# Patient Record
Sex: Female | Born: 1985 | Race: Asian | Hispanic: No | Marital: Married | Smoking: Never smoker
Health system: Southern US, Community
[De-identification: ages and names within clinical notes are randomized; demographics above are authoritative.]

## PROBLEM LIST (undated history)

## (undated) DIAGNOSIS — N83209 Unspecified ovarian cyst, unspecified side: Secondary | ICD-10-CM

## (undated) DIAGNOSIS — E7800 Pure hypercholesterolemia, unspecified: Secondary | ICD-10-CM

## (undated) DIAGNOSIS — R7989 Other specified abnormal findings of blood chemistry: Secondary | ICD-10-CM

## (undated) DIAGNOSIS — O24419 Gestational diabetes mellitus in pregnancy, unspecified control: Secondary | ICD-10-CM

## (undated) HISTORY — DX: Unspecified ovarian cyst, unspecified side: N83.209

## (undated) HISTORY — PX: NO PAST SURGERIES: SHX2092

## (undated) HISTORY — DX: Gestational diabetes mellitus in pregnancy, unspecified control: O24.419

---

## 2010-04-22 ENCOUNTER — Emergency Department: Payer: Self-pay | Admitting: Emergency Medicine

## 2012-02-08 ENCOUNTER — Observation Stay: Payer: Self-pay | Admitting: Obstetrics & Gynecology

## 2012-02-08 LAB — URINALYSIS, COMPLETE
Nitrite: NEGATIVE
Protein: 30
Specific Gravity: 1.024 (ref 1.003–1.030)
WBC UR: 56 /HPF (ref 0–5)

## 2012-04-10 ENCOUNTER — Inpatient Hospital Stay: Payer: Self-pay | Admitting: Obstetrics and Gynecology

## 2012-04-10 LAB — CBC WITH DIFFERENTIAL/PLATELET
Basophil #: 0.2 10*3/uL — ABNORMAL HIGH (ref 0.0–0.1)
Basophil %: 0.8 %
Eosinophil #: 0 10*3/uL (ref 0.0–0.7)
Eosinophil %: 0.1 %
HCT: 37.5 % (ref 35.0–47.0)
HGB: 11.5 g/dL — ABNORMAL LOW (ref 12.0–16.0)
Lymphocyte #: 1.2 10*3/uL (ref 1.0–3.6)
Lymphocyte %: 6.5 %
MCH: 24 pg — ABNORMAL LOW (ref 26.0–34.0)
MCHC: 30.8 g/dL — ABNORMAL LOW (ref 32.0–36.0)
MCV: 78 fL — ABNORMAL LOW (ref 80–100)
Monocyte #: 0.9 x10 3/mm (ref 0.2–0.9)
Neutrophil %: 88.1 %
Platelet: 270 10*3/uL (ref 150–440)
RDW: 16 % — ABNORMAL HIGH (ref 11.5–14.5)

## 2013-06-15 ENCOUNTER — Emergency Department: Payer: Self-pay | Admitting: Emergency Medicine

## 2014-08-16 NOTE — H&P (Signed)
L&D Evaluation:  History Expanded:   HPI 29 yo G2P1 w estimated date of confinement 04/05/12 w nausea and epig pain today only, after food that she feels is questionable.  No diarrhea, fever, other. No contractions.    Gravida 2    Term 1    Patient's Medical History No Chronic Illness    Patient's Surgical History none    Medications Pre Natal Vitamins    Allergies NKDA    Social History none    Family History Non-Contributory   ROS:   ROS All systems were reviewed.  HEENT, CNS, GI, GU, Respiratory, CV, Renal and Musculoskeletal systems were found to be normal.   Exam:   Vital Signs stable    General no apparent distress    Mental Status clear    Abdomen gravid, non-tender    Estimated Fetal Weight Average for gestational age    Back no CVAT    Edema no edema    FHT normal rate with no decels, Non-Stress Test R    Ucx absent    Skin dry   Impression:   Impression GERD/Gastroenteritis   Plan:   Plan fluids, Zantac, Zofran   Electronic Signatures: Hoyt Koch (MD)  (Signed [phone] 18:46)  Authored: L&D Evaluation   Last Updated: 02-Nov-13 18:46 by Hoyt Koch (MD)

## 2014-08-16 NOTE — H&P (Signed)
L&D Evaluation:  History Expanded:   HPI 29yo middle easterm woman who presents to [facility] in labor, her contractions started this am and got wrse a few hours ago, she came in and was placed tin the bed where she SROM with thick meconium and rapidly dilated to 8 then complete. with fast delivery. pt had PNC at [facility].pt has a 7cm adnexal mass unchanged at 28 weeks.    Gravida 2    Term 1    PreTerm 0    Abortion 0    Living 1    Blood Type (Maternal) B positive    Group B Strep Results Maternal (Result >5wks must be treated as unknown) negative    Maternal HIV Negative    Maternal Syphilis Ab Nonreactive    Maternal Varicella Immune    Rubella Results (Maternal) immune    EDC 05-Apr-2013    Presents with contractions    Patient's Surgical History none    Medications Pre Natal Vitamins    Allergies NKDA   ROS:   ROS All systems were reviewed.  HEENT, CNS, GI, GU, Respiratory, CV, Renal and Musculoskeletal systems were found to be normal.   Exam:   Vital Signs stable    General no apparent distress    Mental Status clear    Chest clear    Heart normal sinus rhythm    Back no CVAT    Edema no edema    Ucx regular    Skin dry   Impression:   Impression active labor   Electronic Signatures: Erik Obey (MD)  (Signed 03-Jan-14 15:18)  Authored: L&D Evaluation   Last Updated: 03-Jan-14 15:18 by Erik Obey (MD)

## 2015-01-10 DIAGNOSIS — R7989 Other specified abnormal findings of blood chemistry: Secondary | ICD-10-CM | POA: Insufficient documentation

## 2015-01-10 DIAGNOSIS — N83299 Other ovarian cyst, unspecified side: Secondary | ICD-10-CM | POA: Insufficient documentation

## 2015-11-10 ENCOUNTER — Other Ambulatory Visit: Payer: Self-pay | Admitting: Internal Medicine

## 2015-11-10 DIAGNOSIS — N83299 Other ovarian cyst, unspecified side: Secondary | ICD-10-CM

## 2015-11-14 ENCOUNTER — Ambulatory Visit
Admission: RE | Admit: 2015-11-14 | Discharge: 2015-11-14 | Disposition: A | Discharge status: Home / Self Care | Payer: 59 | Source: Ambulatory Visit | Attending: Internal Medicine | Admitting: Internal Medicine

## 2015-11-14 DIAGNOSIS — Z8719 Personal history of other diseases of the digestive system: Secondary | ICD-10-CM | POA: Diagnosis not present

## 2015-11-14 DIAGNOSIS — N858 Other specified noninflammatory disorders of uterus: Secondary | ICD-10-CM | POA: Diagnosis not present

## 2015-11-14 DIAGNOSIS — N83291 Other ovarian cyst, right side: Secondary | ICD-10-CM | POA: Diagnosis not present

## 2015-11-14 DIAGNOSIS — N83299 Other ovarian cyst, unspecified side: Secondary | ICD-10-CM

## 2015-11-20 ENCOUNTER — Other Ambulatory Visit: Payer: Self-pay | Admitting: Internal Medicine

## 2015-11-20 DIAGNOSIS — R1900 Intra-abdominal and pelvic swelling, mass and lump, unspecified site: Secondary | ICD-10-CM

## 2015-11-28 ENCOUNTER — Ambulatory Visit
Admission: RE | Admit: 2015-11-28 | Discharge: 2015-11-28 | Disposition: A | Discharge status: Home / Self Care | Payer: 59 | Source: Ambulatory Visit | Attending: Internal Medicine | Admitting: Internal Medicine

## 2015-11-28 DIAGNOSIS — R1900 Intra-abdominal and pelvic swelling, mass and lump, unspecified site: Secondary | ICD-10-CM

## 2015-12-05 ENCOUNTER — Encounter: Payer: Self-pay | Admitting: Radiology

## 2015-12-05 ENCOUNTER — Ambulatory Visit
Admission: RE | Admit: 2015-12-05 | Discharge: 2015-12-05 | Disposition: A | Discharge status: Home / Self Care | Payer: 59 | Source: Ambulatory Visit | Attending: Internal Medicine | Admitting: Internal Medicine

## 2015-12-05 DIAGNOSIS — R911 Solitary pulmonary nodule: Secondary | ICD-10-CM | POA: Diagnosis not present

## 2015-12-05 DIAGNOSIS — D271 Benign neoplasm of left ovary: Secondary | ICD-10-CM | POA: Insufficient documentation

## 2015-12-05 DIAGNOSIS — R1900 Intra-abdominal and pelvic swelling, mass and lump, unspecified site: Secondary | ICD-10-CM | POA: Diagnosis present

## 2015-12-05 MED ORDER — IOPAMIDOL (ISOVUE-300) INJECTION 61%: 100.0000 mL | Freq: Once | INTRAVENOUS | Status: DC | PRN

## 2016-04-08 NOTE — L&D Delivery Note (Signed)
Delivery Note Primary OB: [facility] Delivery Provider: Avel Sensor, CNM Gestational Age: Full term Antepartum complications: none Intrapartum complications: None  A viable female was delivered via vertex perentation. No nuchal cord was present. Delivery of the shoulders and body followed without difficulty. The infant was placed on the maternal abdomen.  The umbilical cord was doubly clamped and cut following delayed cord clamping. The placenta was delivered spontaneously and was inspected and found to be intact with a three vessel cord. The cervix and vagina were inspected. A 2nd degree perineal laceration was repaired with 3-0 Vicryl. The fundus was firm. Patient and infant were bonding in stable condition. All counts were correct.  Apgars:9 ,9  Weight:  8 lb 5.7 oz.   Placenta status: spontaneous and Intact.  Cord: 3 vessels;  with the following complications: none.  Anesthesia:  none Episiotomy:  none Lacerations:  2nd Suture Repair: 3.0 none Est. Blood Loss (mL):  300 mL  Mom to postpartum.  Baby to Couplet care / Skin to Skin.  Avel Sensor, CNM [facility], [facility] 01/13/2017  5:15 PM

## 2016-06-19 ENCOUNTER — Encounter: Payer: Self-pay | Admitting: Advanced Practice Midwife

## 2016-07-09 ENCOUNTER — Other Ambulatory Visit: Payer: 59

## 2016-07-09 ENCOUNTER — Ambulatory Visit (INDEPENDENT_AMBULATORY_CARE_PROVIDER_SITE_OTHER): Payer: 59 | Admitting: Advanced Practice Midwife

## 2016-07-09 ENCOUNTER — Encounter: Payer: Self-pay | Admitting: Advanced Practice Midwife

## 2016-07-09 VITALS — BP 118/70 | Ht 64.0 in | Wt 162.0 lb

## 2016-07-09 DIAGNOSIS — O0990 Supervision of high risk pregnancy, unspecified, unspecified trimester: Secondary | ICD-10-CM | POA: Insufficient documentation

## 2016-07-09 DIAGNOSIS — Z1379 Encounter for other screening for genetic and chromosomal anomalies: Secondary | ICD-10-CM

## 2016-07-09 DIAGNOSIS — Z3480 Encounter for supervision of other normal pregnancy, unspecified trimester: Secondary | ICD-10-CM

## 2016-07-09 DIAGNOSIS — Z113 Encounter for screening for infections with a predominantly sexual mode of transmission: Secondary | ICD-10-CM

## 2016-07-09 NOTE — Progress Notes (Signed)
Here for NOB. Heart tones by doppler today. Has hx of ovarian cyst and is concerned about it. She denies pain or symptoms. Able to do dating scan today and 1st trimester screen. Scan agrees with LMP within 4 days. Haven't been able to see the report on computer but I saw results on u/s. Abeer is working on [place] [employer] the report. EDD by LMP. Return in 4 weeks for rob.

## 2016-07-09 NOTE — Patient Instructions (Signed)

## 2016-07-09 NOTE — Progress Notes (Signed)
New Obstetric Patient H&P    Chief Complaint: "Desires prenatal care"   History of Present Illness: Patient is a 31 y.o. [id number] Not Hispanic or Latino female, LMP 04/10/2016 presents with amenorrhea and positive home pregnancy test. Based on her  LMP, her EDD is 01/15/2017 and her estimated gestational age is [redacted]w[redacted]d. Cycles are 5. days, regular, and occur approximately every : 28 days. Her last pap smear was 4 months ago and was no abnormalities.    She had a urine pregnancy test which was positive 2 month(s)  ago. Her last menstrual period was normal and lasted for  5 day(s). Since her LMP she claims she has experienced breast tenderness, fatigue and nausea. She denies vaginal bleeding. Her past medical history is noncontributory. Her prior pregnancies are notable for none  Since her LMP, she admits to the use of tobacco products  no She claims she has gained   8 pounds since the start of her pregnancy.  There are cats in the home in the home  no  She admits close contact with children on a regular basis  yes  She has had chicken pox in the past yes She has had Tuberculosis exposures, symptoms, or previously tested positive for TB   no Current or past history of domestic violence. no  Genetic Screening/Teratology Counseling: (Includes patient, baby's father, or anyone in either family with:)   66. Patient's age >/= 61 at [facility]  no 2. Thalassemia ([place], [place], [place], or Asian background): MCV<80  no 3. Neural tube defect (meningomyelocele, spina bifida, anencephaly)  no 4. Congenital heart defect  no  5. Down syndrome  no 6. Tay-Sachs (Jewish, [place])  no 7. Canavan's Disease  no 8. Sickle cell disease or trait (African)  no  9. Hemophilia or other blood disorders  no  10. Muscular dystrophy  no  11. Cystic fibrosis  no  12. Huntington's Chorea  no  13. Mental retardation/autism  Niece on husband's side of family with autism 36. Other inherited genetic or  chromosomal disorder  no 15. Maternal metabolic disorder (DM, PKU, etc)  no 16. Patient or FOB with a child with a birth defect not listed above no  16a. Patient or FOB with a birth defect themselves no 17. Recurrent pregnancy loss, or stillbirth  no  18. Any medications since LMP other than prenatal vitamins (include vitamins, supplements, OTC meds, drugs, alcohol)  no 19. Any other genetic/environmental exposure to discuss  no  Infection History:   1. Lives with someone with TB or TB exposed  no  2. Patient or partner has history of genital herpes  no 3. Rash or viral illness since LMP  no 4. History of STI (GC, CT, HPV, syphilis, HIV)  no 5. History of recent travel :  no  Other pertinent information:  no     Review of Systems:10 point review of systems negative unless otherwise noted in HPI  Past Medical History:  Past Medical History:  Diagnosis Date  . Ovarian cyst     Past Surgical History:  Past Surgical History:  Procedure Laterality Date  . NO PAST SURGERIES      Gynecologic History: Patient's last menstrual period was 04/10/2016.  Obstetric History: [id number]  Family History:  Family History  Problem Relation Age of Onset  . Hypertension Father     Social History:  Social History   Social History  . Marital status: Married    Spouse name: N/A  .  Number of children: N/A  . Years of education: N/A   Occupational History  . Not on file.   Social History Main Topics  . Smoking status: Never Smoker  . Smokeless tobacco: Never Used  . Alcohol use No  . Drug use: No  . Sexual activity: Yes    Birth control/ protection: None   Other Topics Concern  . Not on file   Social History Narrative  . No narrative on file    Allergies:  No Known Allergies  Medications: Prior to Admission medications   Medication Sig Start Date End Date Taking? Authorizing Provider  Prenatal Vit-Fe Fumarate-FA (PRENATAL VITAMIN PO) Take by mouth.   Yes Historical  Provider, MD    Physical Exam Vitals: Blood pressure 118/70, height 5\' 4"  (1.626 m), weight 162 lb (73.5 kg), last menstrual period 04/10/2016.  General: NAD HEENT: normocephalic, anicteric Thyroid: no enlargement, no palpable nodules Pulmonary: No increased work of breathing, CTAB Cardiovascular: RRR, distal pulses 2+ Abdomen: NABS, soft, non-tender, non-distended.  Umbilicus without lesions.  No hepatomegaly, splenomegaly or masses palpable. No evidence of hernia  Genitourinary:  External: Normal external female genitalia.  Normal urethral meatus, normal  Bartholin's and Skene's glands.    Vagina: Normal vaginal mucosa, no evidence of prolapse.    Cervix: Grossly normal in appearance, no bleeding, no CMT  Uterus: Enlarged, mobile, normal contour.    Adnexa: ovaries non-enlarged, no adnexal masses  Rectal: deferred Extremities: no edema, erythema, or tenderness Neurologic: Grossly intact Psychiatric: mood appropriate, affect full   Assessment: 31 y.o. G3P2002 at [redacted]w[redacted]d presenting to initiate prenatal care  Plan: 1) Avoid alcoholic beverages. 2) Patient encouraged not to smoke.  3) Discontinue the use of all non-medicinal drugs and chemicals.  4) Take prenatal vitamins daily.  5) Nutrition, food safety (fish, cheese advisories, and high nitrite foods) and exercise discussed. 6) Hospital and practice style discussed with cross coverage system.  7) Genetic Screening, such as with 1st Trimester Screening, cell free fetal DNA, AFP testing, and Ultrasound, as well as with amniocentesis and CVS as appropriate, is discussed with patient. At the conclusion of today's visit patient requested genetic testing 8) Patient is asked about travel to areas at risk for the Zika virus, and counseled to avoid travel and exposure to mosquitoes or sexual partners who may have themselves been exposed to the virus. Testing is discussed, and will be ordered as appropriate.    Rod Can, CNM

## 2016-07-09 NOTE — Progress Notes (Signed)
NOB today.  

## 2016-07-09 NOTE — Progress Notes (Signed)
Pt has a hx of a complex ovarian cyst. Unable to see U/S images as of this note.

## 2016-07-11 LAB — GC/CHLAMYDIA PROBE AMP
Chlamydia trachomatis, NAA: NEGATIVE
Neisseria gonorrhoeae by PCR: NEGATIVE

## 2016-07-11 LAB — URINE CULTURE

## 2016-08-07 ENCOUNTER — Encounter: Payer: 59 | Admitting: Certified Nurse Midwife

## 2016-08-08 ENCOUNTER — Ambulatory Visit (INDEPENDENT_AMBULATORY_CARE_PROVIDER_SITE_OTHER): Payer: 59 | Admitting: Certified Nurse Midwife

## 2016-08-08 VITALS — BP 104/64 | Wt 168.0 lb

## 2016-08-08 DIAGNOSIS — D271 Benign neoplasm of left ovary: Secondary | ICD-10-CM

## 2016-08-08 DIAGNOSIS — O0992 Supervision of high risk pregnancy, unspecified, second trimester: Secondary | ICD-10-CM

## 2016-08-08 DIAGNOSIS — O0990 Supervision of high risk pregnancy, unspecified, unspecified trimester: Secondary | ICD-10-CM

## 2016-08-08 NOTE — Progress Notes (Signed)
Pt reports no problems.   

## 2016-08-08 NOTE — Progress Notes (Addendum)
FH: U-2. Not feeling fetal movement yet FHTs WNL. Did not have NOB labs or first trimester test drawn at 4/3 NOB appt. Will have return 8 May for labs and quad screen [place] in 3 weeks for anatomy and follow up on 9 cm left ovarian mass (teratoma) Not having any abdominal pain.  Addendum: 08/09/2016 After patient left [facility], saw that urine culture from NOB returned with 25-50, 000 E faecalis Will call in Macrobid BID x 7 days  Dalia Heading, [place]

## 2016-08-09 MED ORDER — NITROFURANTOIN MONOHYD MACRO 100 MG PO CAPS: 100.0000 mg | ORAL_CAPSULE | Freq: Two times a day (BID) | ORAL | 0 refills | Status: DC

## 2016-08-09 NOTE — Addendum Note (Signed)
Addended by: Dalia Heading on: 08/09/2016 12:05 PM   Modules accepted: Orders

## 2016-08-09 NOTE — Addendum Note (Signed)
Addended by: Dalia Heading on: 08/09/2016 08:34 AM   Modules accepted: Orders

## 2016-08-09 NOTE — Addendum Note (Signed)
Addended by: Dalia Heading on: 08/09/2016 08:32 AM   Modules accepted: Orders

## 2016-08-13 ENCOUNTER — Other Ambulatory Visit: Payer: 59

## 2016-08-13 DIAGNOSIS — O0992 Supervision of high risk pregnancy, unspecified, second trimester: Secondary | ICD-10-CM

## 2016-08-14 ENCOUNTER — Encounter: Payer: 59 | Admitting: Certified Nurse Midwife

## 2016-08-14 LAB — RPR+RH+ABO+RUB AB+AB SCR+CB...
Antibody Screen: NEGATIVE
HEP B S AG: NEGATIVE
HIV Screen 4th Generation wRfx: NONREACTIVE
Hematocrit: 36.1 % (ref 34.0–46.6)
Hemoglobin: 12.2 g/dL (ref 11.1–15.9)
MCH: 28.6 pg (ref 26.6–33.0)
MCHC: 33.8 g/dL (ref 31.5–35.7)
MCV: 85 fL (ref 79–97)
Platelets: 290 10*3/uL (ref 150–379)
RBC: 4.26 x10E6/uL (ref 3.77–5.28)
RDW: 13.9 % (ref 12.3–15.4)
RPR Ser Ql: NONREACTIVE
Rh Factor: POSITIVE
Rubella Antibodies, IGG: 16.2 index (ref >0.99)
VARICELLA: 807 {index} (ref >165)
WBC: 11.9 10*3/uL — ABNORMAL HIGH (ref 3.4–10.8)

## 2016-08-16 LAB — AFP, QUAD SCREEN
DIA MOM VALUE: 1.17
DIA Value (EIA): 185.52 pg/mL
DSR (By Age)    1 IN: 690
DSR (SECOND TRIMESTER) 1 IN: 2810
GESTATIONAL AGE AFP: 18.4 wk
MATERNAL AGE AT EDD: 30.1 a
MSAFP Mom: 0.82
MSAFP: 34.5 ng/mL
MSHCG MOM: 0.85
MSHCG: 20610 m[IU]/mL
Osb Risk: 10000
T18 (By Age): 1:2690 {titer}
TEST RESULTS AFP: NEGATIVE
UE3 MOM: 1
UE3 VALUE: 1.33 ng/mL
WEIGHT: 168 [lb_av]

## 2016-08-29 ENCOUNTER — Other Ambulatory Visit: Payer: 59

## 2016-08-29 ENCOUNTER — Encounter: Payer: 59 | Admitting: Advanced Practice Midwife

## 2016-09-04 ENCOUNTER — Ambulatory Visit (INDEPENDENT_AMBULATORY_CARE_PROVIDER_SITE_OTHER): Payer: 59 | Admitting: Advanced Practice Midwife

## 2016-09-04 ENCOUNTER — Ambulatory Visit (INDEPENDENT_AMBULATORY_CARE_PROVIDER_SITE_OTHER): Payer: 59

## 2016-09-04 VITALS — BP 108/68 | Wt 171.0 lb

## 2016-09-04 DIAGNOSIS — O0992 Supervision of high risk pregnancy, unspecified, second trimester: Secondary | ICD-10-CM | POA: Diagnosis not present

## 2016-09-04 DIAGNOSIS — D271 Benign neoplasm of left ovary: Secondary | ICD-10-CM

## 2016-09-04 DIAGNOSIS — Z3A21 21 weeks gestation of pregnancy: Secondary | ICD-10-CM

## 2016-09-04 NOTE — Progress Notes (Signed)
left side back pain/Anatomy scan today

## 2016-09-04 NOTE — Progress Notes (Signed)
Anatomy scan today incomplete for 3 vessel cord. F/U scan at 28 week visit for cord and left ovary. Left ovary today measures 3 x 3.8 cm. No LOF, VB. Patient completed course of abx for uti.

## 2016-09-06 ENCOUNTER — Other Ambulatory Visit: Payer: Self-pay | Admitting: Advanced Practice Midwife

## 2016-09-06 DIAGNOSIS — IMO0002 Reserved for concepts with insufficient information to code with codable children: Secondary | ICD-10-CM

## 2016-09-06 DIAGNOSIS — Z0489 Encounter for examination and observation for other specified reasons: Secondary | ICD-10-CM

## 2016-10-02 ENCOUNTER — Ambulatory Visit (INDEPENDENT_AMBULATORY_CARE_PROVIDER_SITE_OTHER): Payer: 59

## 2016-10-02 ENCOUNTER — Ambulatory Visit (INDEPENDENT_AMBULATORY_CARE_PROVIDER_SITE_OTHER): Payer: 59 | Admitting: Obstetrics & Gynecology

## 2016-10-02 VITALS — BP 100/70 | Wt 175.0 lb

## 2016-10-02 DIAGNOSIS — Z3A25 25 weeks gestation of pregnancy: Secondary | ICD-10-CM

## 2016-10-02 DIAGNOSIS — Z3490 Encounter for supervision of normal pregnancy, unspecified, unspecified trimester: Secondary | ICD-10-CM | POA: Insufficient documentation

## 2016-10-02 DIAGNOSIS — Z048 Encounter for examination and observation for other specified reasons: Secondary | ICD-10-CM

## 2016-10-02 DIAGNOSIS — Z362 Encounter for other antenatal screening follow-up: Secondary | ICD-10-CM | POA: Diagnosis not present

## 2016-10-02 DIAGNOSIS — Z0489 Encounter for examination and observation for other specified reasons: Secondary | ICD-10-CM

## 2016-10-02 DIAGNOSIS — IMO0002 Reserved for concepts with insufficient information to code with codable children: Secondary | ICD-10-CM

## 2016-10-02 NOTE — Progress Notes (Signed)
[place] discussed, ov cyst stable at 3.5 cm.    Discussed cyst eval and possible excision IF cesarean.  Otherwise rescan PP PNV. Glc nv.

## 2016-10-02 NOTE — Patient Instructions (Signed)

## 2016-10-29 ENCOUNTER — Telehealth: Payer: Self-pay

## 2016-10-29 NOTE — Telephone Encounter (Signed)
LMTC

## 2016-10-29 NOTE — Telephone Encounter (Signed)
Please advise 

## 2016-10-29 NOTE — Telephone Encounter (Signed)
Can wait till tomorrow if it worsens or she has arm numbness, nausea she probably needs to go to the ER

## 2016-10-29 NOTE — Telephone Encounter (Signed)
Please let patient know.

## 2016-10-29 NOTE — Telephone Encounter (Signed)
Pt ate and then had a very sharp pain in stomach under left ribs.  She took a nexium for it.  What to do?  Pain is very sharp.

## 2016-10-30 ENCOUNTER — Other Ambulatory Visit: Payer: 59

## 2016-10-30 ENCOUNTER — Encounter: Payer: 59 | Admitting: Obstetrics and Gynecology

## 2016-10-30 NOTE — Telephone Encounter (Signed)
Pt cancelled today's appt and rescheduled for tomorrow.  Left second msg.

## 2016-10-31 ENCOUNTER — Ambulatory Visit (INDEPENDENT_AMBULATORY_CARE_PROVIDER_SITE_OTHER): Payer: 59 | Admitting: Obstetrics and Gynecology

## 2016-10-31 ENCOUNTER — Other Ambulatory Visit: Payer: 59

## 2016-10-31 VITALS — BP 112/68 | Wt 182.0 lb

## 2016-10-31 DIAGNOSIS — Z3A29 29 weeks gestation of pregnancy: Secondary | ICD-10-CM

## 2016-10-31 NOTE — Progress Notes (Signed)
28 week labs today If stands long time starts sweating/SOB

## 2016-10-31 NOTE — Progress Notes (Signed)
Pos PNVs. No VB, LOF. 28 wk labs today. Increase hydration. Don't stand in 1 position without moving/lie down with elevation. RTO 2 wks.

## 2016-10-31 NOTE — Telephone Encounter (Signed)
Pt kept today's appt.

## 2016-11-01 ENCOUNTER — Telehealth: Payer: Self-pay | Admitting: Obstetrics and Gynecology

## 2016-11-01 ENCOUNTER — Other Ambulatory Visit: Payer: Self-pay | Admitting: Obstetrics & Gynecology

## 2016-11-01 DIAGNOSIS — R899 Unspecified abnormal finding in specimens from other organs, systems and tissues: Secondary | ICD-10-CM

## 2016-11-01 LAB — 28 WEEK RH+PANEL
BASOS: 0 %
Basophils Absolute: 0 10*3/uL (ref 0.0–0.2)
EOS (ABSOLUTE): 0.2 10*3/uL (ref 0.0–0.4)
Eos: 2 %
Gestational Diabetes Screen: 141 mg/dL — ABNORMAL HIGH (ref 65–139)
HEMATOCRIT: 34.4 % (ref 34.0–46.6)
HEMOGLOBIN: 11.4 g/dL (ref 11.1–15.9)
HIV SCREEN 4TH GENERATION: NONREACTIVE
IMMATURE GRANS (ABS): 0.1 10*3/uL (ref 0.0–0.1)
Immature Granulocytes: 1 %
LYMPHS ABS: 1.8 10*3/uL (ref 0.7–3.1)
LYMPHS: 14 %
MCH: 27.1 pg (ref 26.6–33.0)
MCHC: 33.1 g/dL (ref 31.5–35.7)
MCV: 82 fL (ref 79–97)
MONOS ABS: 1 10*3/uL — AB (ref 0.1–0.9)
Monocytes: 8 %
NEUTROS ABS: 9.5 10*3/uL — AB (ref 1.4–7.0)
Neutrophils: 75 %
Platelets: 298 10*3/uL (ref 150–379)
RBC: 4.2 x10E6/uL (ref 3.77–5.28)
RDW: 14.7 % (ref 12.3–15.4)
RPR Ser Ql: NONREACTIVE
WBC: 12.7 10*3/uL — ABNORMAL HIGH (ref 3.4–10.8)

## 2016-11-01 NOTE — Telephone Encounter (Signed)
Notes recorded by Gae Dry, MD on 11/01/2016 at 10:34 AM EDT Pt seen yesterday by Elmo Putt as well, but need to schedule for 3 hour GTT due to abnormal Glucola. Let her know; if need anything else let ABC know due to vacation next week. I called and left voicemail for patient to call back to schedule 3 hour GTT.

## 2016-11-01 NOTE — Progress Notes (Signed)
Please schedule 3 hr pt aware you will be calling

## 2016-11-01 NOTE — Progress Notes (Signed)
Pt seen yesterday by Elmo Putt as well, but need to schedule for 3 hour GTT due to abnormal Glucola.  Let her know; if need anything else let ABC know due to vacation next week.

## 2016-11-04 NOTE — Progress Notes (Signed)
Judy Gomez left message for Pt to call back

## 2016-11-04 NOTE — Telephone Encounter (Signed)
LVm for patient to call back to be schedule

## 2016-11-07 NOTE — Progress Notes (Signed)
Please call back and schedule

## 2016-11-14 ENCOUNTER — Ambulatory Visit (INDEPENDENT_AMBULATORY_CARE_PROVIDER_SITE_OTHER): Payer: 59 | Admitting: Advanced Practice Midwife

## 2016-11-14 ENCOUNTER — Other Ambulatory Visit: Payer: 59

## 2016-11-14 VITALS — BP 118/74 | Wt 183.0 lb

## 2016-11-14 DIAGNOSIS — R899 Unspecified abnormal finding in specimens from other organs, systems and tissues: Secondary | ICD-10-CM

## 2016-11-14 DIAGNOSIS — Z3A31 31 weeks gestation of pregnancy: Secondary | ICD-10-CM

## 2016-11-14 NOTE — Patient Instructions (Signed)
Gestational Diabetes Mellitus, Self Care Caring for yourself after you have been diagnosed with gestational diabetes (gestational diabetes mellitus) means keeping your blood sugar (glucose) under control with a balance of:  Nutrition.  Exercise.  Lifestyle changes.  Medicines or insulin, if necessary.  Support from your team of health care providers and others.  The following information explains what you need to know to manage your gestational diabetes at home. What do I need to do to manage my blood glucose?  Check your blood glucose every day during your pregnancy. Do this as often as told by your health care provider.  Contact your health care provider if your blood glucose is above your target for 2 tests in a row. Your health care provider will set individualized treatment goals for you. Generally, the goal of treatment is to maintain the following blood glucose levels during pregnancy:  After not eating for 8 hours (after fasting): at or below 95 mg/dL (5.3 mmol/L).  After meals (postprandial): ? One hour after a meal: at or below 140 mg/dL (7.8 mmol/L). ? Two hours after a meal: at or below 120 mg/dL (6.7 mmol/L).  A1c (hemoglobin A1c) level: 6-6.5%.  What do I need to know about hyperglycemia and hypoglycemia? What is hyperglycemia? Hyperglycemia, also called high blood glucose, occurs when blood glucose is too high. Make sure you know the early signs of hyperglycemia, such as:  Increased thirst.  Hunger.  Feeling very tired.  Needing to urinate more often than usual.  Blurry vision.  What is hypoglycemia? Hypoglycemia, also called low blood glucose, occurswith a blood glucose level at or below 70 mg/dL (3.9 mmol/L). The risk for hypoglycemia increases during or after exercise, during sleep, during illness, and when skipping meals or not eating for a long time (fasting). It is important to know the symptoms of hypoglycemia and treat it right away. Always have a  15-gram rapid-acting carbohydrate snack with you to treat low blood glucose.Family members and close friends should also know the symptoms and should understand how to treat hypoglycemia, in case you are not able to treat yourself. What are the symptoms of hypoglycemia? Hypoglycemia symptoms can include:  Hunger.  Anxiety.  Sweating and feeling clammy.  Confusion.  Dizziness or feeling light-headed.  Sleepiness.  Nausea.  Increased heart rate.  Headache.  Blurry vision.  Seizure.  Nightmares.  Tingling or numbness around the mouth, lips, or tongue.  A change in speech.  Decreased ability to concentrate.  A change in coordination.  Restless sleep.  Tremors or shakes.  Fainting.  Irritability.  How do I treat hypoglycemia?  If you are alert and able to swallow safely, follow the 15:15 rule:  Take 15 grams of a rapid-acting carbohydrate. Rapid-acting options include: ? 1 tube of glucose gel. ? 3 glucose pills. ? 6-8 pieces of hard candy. ? 4 oz (120 mL) of fruit juice. ? 4 oz (120 mL) of regular (not diet) soda.  Check your blood glucose 15 minutes after you take the carbohydrate.  If the repeat blood glucose level is still at or below 70 mg/dL (3.9 mmol/L), take 15 grams of a carbohydrate again.  If your blood glucose level does not increase above 70 mg/dL (3.9 mmol/L) after 3 tries, seek emergency medical care.  After your blood glucose level returns to normal, eat a meal or a snack within 1 hour.  How do I treat severe hypoglycemia? Severe hypoglycemia is when your blood glucose level is at or below 54 mg/dL (  3 mmol/L). Severe hypoglycemia is an emergency. Do not wait to see if the symptoms will go away. Get medical help right away. Call your local emergency services (911 in the [place] Do not drive yourself to the hospital. If you have severe hypoglycemia and you cannot eat or drink, you may need an injection of glucagon. A family member or close  friend should learn how to check your blood glucose and how to give you a glucagon injection. Ask your health care provider if you need to have an emergency glucagon injection kit available. Severe hypoglycemia may need to be treated in a hospital. The treatment may include getting glucose through an IV tube. You may also need treatment for the cause of your hypoglycemia. What else can I do to manage my gestational diabetes? Take your diabetes medicines as told  If your health care provider prescribed insulin or diabetes medicines, take them every day.  Do not run out of insulin or other diabetes medicines that you take. Plan ahead so you always have these available.  If you use insulin, adjust your dosage based on how physically active you are and what foods you eat. Your health care provider will tell you how to adjust your dosage. Make healthy food choices  The things that you eat and drink affect your blood glucose. Making good choices helps to control your diabetes and prevent other health problems. A healthy meal plan includes eating lean proteins, complex carbohydrates, fresh fruits and vegetables, low-fat dairy products, and healthy fats. Make an appointment to see a diet and nutrition specialist (registered dietitian) to help you create an eating plan that is right for you. Make sure that you:  Follow instructions from your health care provider about eating or drinking restrictions.  Drink enough fluid to keep your urine clear or pale yellow.  Eat healthy snacks between nutritious meals.  Track the carbohydrates that you eat. Do this by reading food labels and learning the standard serving sizes of foods.  Follow your sick day plan whenever you cannot eat or drink as usual. Make this plan in advance with your health care provider.  Stay active   Do at least 30 minutes of physical activity a day, or as much physical activity as your health care provider recommends during your  pregnancy. ? Doing 10 minutes of exercise 30 minutes after each meal may help to control postprandial blood glucose levels.  If you start a new exercise or activity, work with your health care provider to adjust your insulin, medicines, or food intake as needed. Make healthy lifestyle choices  Do not drink alcohol.  Do not use any tobacco products, such as cigarettes, chewing tobacco, and e-cigarettes. If you need help quitting, ask your health care provider.  Learn to manage stress. If you need help with this, ask your health care provider. Care for your body  Keep your immunizations up to date.  Brush your teeth and gums two times a day, and floss at least one time a day.  Visit your dentist at least once every 6 months.  Maintain a healthy weight during your pregnancy. General instructions   Take over-the-counter and prescription medicines only as told by your health care provider.  Talk with your health care provider about your risk for high blood pressure during pregnancy (preeclampsia or eclampsia).  Share your diabetes management plan with people in your workplace, school, and household.  Check your urine for ketones during your pregnancy when you are ill and   as told by your health care provider.  Carry a medical alert card or wear medical alert jewelry.  Ask your health care provider: ? Do I need to meet with a diabetes educator? ? Where can I find a support group for people with diabetes?  Keep all follow-up visits during your pregnancy (prenatal) and after delivery (postnatal) as told by your health care provider. This is important. Get the care that you need after delivery  Have your blood glucose level checked 4-12 weeks after delivery. This is done with an oral glucose tolerance test (OGTT).  Get screened for diabetes at least every 3 years, or as often as told by your health care provider. Where to find more information: To learn more about gestational  diabetes, visit:  American Diabetes Association (ADA): www.diabetes.org/diabetes-basics/gestational  Centers for Disease Control and Prevention (CDC): www.cdc.gov/diabetes/pubs/pdf/gestationalDiabetes.pdf  This information is not intended to replace advice given to you by your health care provider. Make sure you discuss any questions you have with your health care provider. Document Released: 07/17/2015 Document Revised: 08/31/2015 Document Reviewed: 04/28/2015 Elsevier Interactive Patient Education  2017 Elsevier Inc.  

## 2016-11-14 NOTE — Progress Notes (Signed)
Feeling fatigued. Encouraged Fe supplement. Sample of Fusion Plus and Provida OB given. No Ctx's, LOF, VB. 3 hr gtt today.

## 2016-11-14 NOTE — Progress Notes (Signed)
3hr gtt today, will get TDAP at nv. No vb. No lof.

## 2016-11-15 ENCOUNTER — Other Ambulatory Visit: Payer: 59

## 2016-11-15 LAB — GESTATIONAL GLUCOSE TOLERANCE
GLUCOSE 1 HOUR GTT: 174 mg/dL (ref 65–179)
GLUCOSE 2 HOUR GTT: 141 mg/dL (ref 65–154)
Glucose, Fasting: 76 mg/dL (ref 65–94)
Glucose, GTT - 3 Hour: 93 mg/dL (ref 65–139)

## 2016-11-15 NOTE — Progress Notes (Signed)
Let her know blood sugar is normal.

## 2016-11-20 ENCOUNTER — Encounter: Payer: Self-pay | Admitting: Advanced Practice Midwife

## 2016-11-22 ENCOUNTER — Other Ambulatory Visit: Payer: Self-pay | Admitting: Advanced Practice Midwife

## 2016-11-22 DIAGNOSIS — Z3490 Encounter for supervision of normal pregnancy, unspecified, unspecified trimester: Secondary | ICD-10-CM

## 2016-11-22 MED ORDER — FUSION 65-65-25-30 MG PO CAPS: 1.0000 | ORAL_CAPSULE | Freq: Every day | ORAL | 1 refills | Status: DC

## 2016-11-22 MED ORDER — PROVIDA OB 20-20-1.25 MG PO CAPS: 1.0000 | ORAL_CAPSULE | Freq: Every day | ORAL | 4 refills | Status: AC

## 2016-11-28 ENCOUNTER — Encounter: Payer: 59 | Admitting: Advanced Practice Midwife

## 2016-12-06 ENCOUNTER — Encounter: Payer: Self-pay | Admitting: Advanced Practice Midwife

## 2016-12-06 ENCOUNTER — Telehealth: Payer: Self-pay

## 2016-12-06 NOTE — Telephone Encounter (Signed)
Pt is an OB/ROB is with [facility] on 9/4. Please advise

## 2016-12-06 NOTE — Telephone Encounter (Signed)
Pt calling, is so stressed, dad died, hsb giving her a hard time.  Has appt Tues but can't wait that long she is so stressed.  Can something please be called in to help her relax so she can sleep?  [phone]

## 2016-12-06 NOTE — Telephone Encounter (Signed)
Spoke with patient and recommend she start with unisom or tylenol pm and I will check in with her tomorrow.

## 2016-12-10 ENCOUNTER — Encounter: Payer: 59 | Admitting: Advanced Practice Midwife

## 2016-12-10 ENCOUNTER — Ambulatory Visit (INDEPENDENT_AMBULATORY_CARE_PROVIDER_SITE_OTHER): Payer: 59 | Admitting: Advanced Practice Midwife

## 2016-12-10 VITALS — BP 118/78 | Wt 190.0 lb

## 2016-12-10 DIAGNOSIS — Z3A34 34 weeks gestation of pregnancy: Secondary | ICD-10-CM

## 2016-12-10 NOTE — Progress Notes (Signed)
Has been having trouble sleeping lately. Increased stress due to her father died last week and she was unable to travel to where he was. I recommended either tylenol PM or unisom for her and she had some success taking that, but doesn't want to take it every night. Recommended stay active during the day to improve night time sleeping, chamomile tea, lavender essential oil. No contractions, LOF or VB. Encouraged healthy foods and staying active for healthy weight gain. ROB/GBS/aptima in 2 weeks.

## 2016-12-10 NOTE — Progress Notes (Signed)
No vb, no lof, trouble sleeping.

## 2016-12-26 ENCOUNTER — Encounter: Payer: 59 | Admitting: Advanced Practice Midwife

## 2017-01-01 ENCOUNTER — Ambulatory Visit (INDEPENDENT_AMBULATORY_CARE_PROVIDER_SITE_OTHER): Payer: 59 | Admitting: Advanced Practice Midwife

## 2017-01-01 VITALS — BP 100/70 | Wt 198.0 lb

## 2017-01-01 DIAGNOSIS — Z3A38 38 weeks gestation of pregnancy: Secondary | ICD-10-CM

## 2017-01-01 DIAGNOSIS — Z3685 Encounter for antenatal screening for Streptococcus B: Secondary | ICD-10-CM

## 2017-01-01 NOTE — Progress Notes (Signed)
  Routine Prenatal Care Visit  Subjective  Judy Gomez is a 31 y.o. G3P2002 at [redacted]w[redacted]d being seen today for ongoing prenatal care.  She is currently monitored for a low-risk pregnancy and has Complex ovarian cyst; Low vitamin D level; and Encounter for supervision of low-risk pregnancy, antepartum on her problem list.  ----------------------------------------------------------------------------------- Patient reports no complaints.   Contractions: Not present. Vag. Bleeding: None.  Movement: Present. Denies leaking of fluid.  ----------------------------------------------------------------------------------- The following portions of the patient's history were reviewed and updated as appropriate: allergies, current medications, past family history, past medical history, past social history, past surgical history and problem list. Problem list updated.   Objective  Blood pressure 100/70, weight 198 lb (89.8 kg), last menstrual period 04/10/2016. Pregravid weight 162 lb (73.5 kg) Total Weight Gain 36 lb (16.3 kg) Urinalysis: Urine Protein: Negative Urine Glucose: Negative  Fetal Status:     Movement: Present     General:  Alert, oriented and cooperative. Patient is in no acute distress.  Skin: Skin is warm and dry. No rash noted.   Cardiovascular: Normal heart rate noted  Respiratory: Normal respiratory effort, no problems with respiration noted  Abdomen: Soft, gravid, appropriate for gestational age. Pain/Pressure: Present     Pelvic:  Cervical exam deferred        Extremities: Normal range of motion.     Mental Status: Normal mood and affect. Normal behavior. Normal judgment and thought content.     Assessment   31 y.o. [id number] at [redacted]w[redacted]d by  01/15/2017, by Last Menstrual Period presenting for routine prenatal visit  Plan   pregnancy Problems (from 07/09/16 to present)    No problems associated with this episode.    GBS screen done today since patient has not been to  clinic since 34 weeks.   Term labor symptoms and general obstetric precautions including but not limited to vaginal bleeding, contractions, leaking of fluid and fetal movement were reviewed in detail with the patient. Please refer to After Visit Summary for other counseling recommendations.   Return in about 1 week (around 01/08/2017) for rob.  Rod Can, CNM  01/01/2017 4:42 PM

## 2017-01-01 NOTE — Patient Instructions (Signed)
Vaginal Delivery Vaginal delivery means that you will give birth by pushing your baby out of your birth canal (vagina). A team of health care providers will help you before, during, and after vaginal delivery. Birth experiences are unique for every woman and every pregnancy, and birth experiences vary depending on where you choose to give birth. What should I do to prepare for my baby's birth? Before your baby is born, it is important to talk with your health care provider about:  Your labor and delivery preferences. These may include: ? Medicines that you may be given. ? How you will manage your pain. This might include non-medical pain relief techniques or injectable pain relief such as epidural analgesia. ? How you and your baby will be monitored during labor and delivery. ? Who may be in the labor and delivery room with you. ? Your feelings about surgical delivery of your baby (cesarean delivery, or C-section) if this becomes necessary. ? Your feelings about receiving donated blood through an IV tube (blood transfusion) if this becomes necessary.  Whether you are able: ? To take pictures or videos of the birth. ? To eat during labor and delivery. ? To move around, walk, or change positions during labor and delivery.  What to expect after your baby is born, such as: ? Whether delayed umbilical cord clamping and cutting is offered. ? Who will care for your baby right after birth. ? Medicines or tests that may be recommended for your baby. ? Whether breastfeeding is supported in your hospital or birth center. ? How long you will be in the hospital or birth center.  How any medical conditions you have may affect your baby or your labor and delivery experience.  To prepare for your baby's birth, you should also:  Attend all of your health care visits before delivery (prenatal visits) as recommended by your health care provider. This is important.  Prepare your home for your baby's  arrival. Make sure that you have: ? Diapers. ? Baby clothing. ? Feeding equipment. ? Safe sleeping arrangements for you and your baby.  Install a car seat in your vehicle. Have your car seat checked by a certified car seat installer to make sure that it is installed safely.  Think about who will help you with your new baby at home for at least the first several weeks after delivery.  What can I expect when I arrive at the birth center or hospital? Once you are in labor and have been admitted into the hospital or birth center, your health care provider may:  Review your pregnancy history and any concerns you have.  Insert an IV tube into one of your veins. This is used to give you fluids and medicines.  Check your blood pressure, pulse, temperature, and heart rate (vital signs).  Check whether your bag of water (amniotic sac) has broken (ruptured).  Talk with you about your birth plan and discuss pain control options.  Monitoring Your health care provider may monitor your contractions (uterine monitoring) and your baby's heart rate (fetal monitoring). You may need to be monitored:  Often, but not continuously (intermittently).  All the time or for long periods at a time (continuously). Continuous monitoring may be needed if: ? You are taking certain medicines, such as medicine to relieve pain or make your contractions stronger. ? You have pregnancy or labor complications.  Monitoring may be done by:  Placing a special stethoscope or a handheld monitoring device on your abdomen to   check your baby's heartbeat, and feeling your abdomen for contractions. This method of monitoring does not continuously record your baby's heartbeat or your contractions.  Placing monitors on your abdomen (external monitors) to record your baby's heartbeat and the frequency and length of contractions. You may not have to wear external monitors all the time.  Placing monitors inside of your uterus  (internal monitors) to record your baby's heartbeat and the frequency, length, and strength of your contractions. ? Your health care provider may use internal monitors if he or she needs more information about the strength of your contractions or your baby's heart rate. ? Internal monitors are put in place by passing a thin, flexible wire through your vagina and into your uterus. Depending on the type of monitor, it may remain in your uterus or on your baby's head until birth. ? Your health care provider will discuss the benefits and risks of internal monitoring with you and will ask for your permission before inserting the monitors.  Telemetry. This is a type of continuous monitoring that can be done with external or internal monitors. Instead of having to stay in bed, you are able to move around during telemetry. Ask your health care provider if telemetry is an option for you.  Physical exam Your health care provider may perform a physical exam. This may include:  Checking whether your baby is positioned: ? With the head toward your vagina (head-down). This is most common. ? With the head toward the top of your uterus (head-up or breech). If your baby is in a breech position, your health care provider may try to turn your baby to a head-down position so you can deliver vaginally. If it does not seem that your baby can be born vaginally, your provider may recommend surgery to deliver your baby. In rare cases, you may be able to deliver vaginally if your baby is head-up (breech delivery). ? Lying sideways (transverse). Babies that are lying sideways cannot be delivered vaginally.  Checking your cervix to determine: ? Whether it is thinning out (effacing). ? Whether it is opening up (dilating). ? How low your baby has moved into your birth canal.  What are the three stages of labor and delivery?  Normal labor and delivery is divided into the following three stages: Stage 1  Stage 1 is the  longest stage of labor, and it can last for hours or days. Stage 1 includes: ? Early labor. This is when contractions may be irregular, or regular and mild. Generally, early labor contractions are more than 10 minutes apart. ? Active labor. This is when contractions get longer, more regular, more frequent, and more intense. ? The transition phase. This is when contractions happen very close together, are very intense, and may last longer than during any other part of labor.  Contractions generally feel mild, infrequent, and irregular at first. They get stronger, more frequent (about every 2-3 minutes), and more regular as you progress from early labor through active labor and transition.  Many women progress through stage 1 naturally, but you may need help to continue making progress. If this happens, your health care provider may talk with you about: ? Rupturing your amniotic sac if it has not ruptured yet. ? Giving you medicine to help make your contractions stronger and more frequent.  Stage 1 ends when your cervix is completely dilated to 4 inches (10 cm) and completely effaced. This happens at the end of the transition phase. Stage 2  Once   your cervix is completely effaced and dilated to 4 inches (10 cm), you may start to feel an urge to push. It is common for the body to naturally take a rest before feeling the urge to push, especially if you received an epidural or certain other pain medicines. This rest period may last for up to 1-2 hours, depending on your unique labor experience.  During stage 2, contractions are generally less painful, because pushing helps relieve contraction pain. Instead of contraction pain, you may feel stretching and burning pain, especially when the widest part of your baby's head passes through the vaginal opening (crowning).  Your health care provider will closely monitor your pushing progress and your baby's progress through the vagina during stage 2.  Your  health care provider may massage the area of skin between your vaginal opening and anus (perineum) or apply warm compresses to your perineum. This helps it stretch as the baby's head starts to crown, which can help prevent perineal tearing. ? In some cases, an incision may be made in your perineum (episiotomy) to allow the baby to pass through the vaginal opening. An episiotomy helps to make the opening of the vagina larger to allow more room for the baby to fit through.  It is very important to breathe and focus so your health care provider can control the delivery of your baby's head. Your health care provider may have you decrease the intensity of your pushing, to help prevent perineal tearing.  After delivery of your baby's head, the shoulders and the rest of the body generally deliver very quickly and without difficulty.  Once your baby is delivered, the umbilical cord may be cut right away, or this may be delayed for 1-2 minutes, depending on your baby's health. This may vary among health care providers, hospitals, and birth centers.  If you and your baby are healthy enough, your baby may be placed on your chest or abdomen to help maintain the baby's temperature and to help you bond with each other. Some mothers and babies start breastfeeding at this time. Your health care team will dry your baby and help keep your baby warm during this time.  Your baby may need immediate care if he or she: ? Showed signs of distress during labor. ? Has a medical condition. ? Was born too early (prematurely). ? Had a bowel movement before birth (meconium). ? Shows signs of difficulty transitioning from being inside the uterus to being outside of the uterus. If you are planning to breastfeed, your health care team will help you begin a feeding. Stage 3  The third stage of labor starts immediately after the birth of your baby and ends after you deliver the placenta. The placenta is an organ that develops  during pregnancy to provide oxygen and nutrients to your baby in the womb.  Delivering the placenta may require some pushing, and you may have mild contractions. Breastfeeding can stimulate contractions to help you deliver the placenta.  After the placenta is delivered, your uterus should tighten (contract) and become firm. This helps to stop bleeding in your uterus. To help your uterus contract and to control bleeding, your health care provider may: ? Give you medicine by injection, through an IV tube, by mouth, or through your rectum (rectally). ? Massage your abdomen or perform a vaginal exam to remove any blood clots that are left in your uterus. ? Empty your bladder by placing a thin, flexible tube (catheter) into your bladder. ? Encourage   you to breastfeed your baby. After labor is over, you and your baby will be monitored closely to ensure that you are both healthy until you are ready to go home. Your health care team will teach you how to care for yourself and your baby. This information is not intended to replace advice given to you by your health care provider. Make sure you discuss any questions you have with your health care provider. Document Released: 01/02/2008 Document Revised: 10/13/2015 Document Reviewed: 04/09/2015 [employer] Interactive Patient Education  2018 [employer]  

## 2017-01-03 LAB — STREP GP B NAA: STREP GROUP B AG: NEGATIVE

## 2017-01-07 ENCOUNTER — Telehealth: Payer: Self-pay

## 2017-01-07 NOTE — Telephone Encounter (Signed)
FMLA/DISABILITY form for [employer] filled out and given to [place] for processing.

## 2017-01-08 ENCOUNTER — Ambulatory Visit (INDEPENDENT_AMBULATORY_CARE_PROVIDER_SITE_OTHER): Payer: 59 | Admitting: Maternal Newborn

## 2017-01-08 VITALS — BP 102/68 | Wt 198.0 lb

## 2017-01-08 DIAGNOSIS — Z3A39 39 weeks gestation of pregnancy: Secondary | ICD-10-CM

## 2017-01-08 DIAGNOSIS — Z23 Encounter for immunization: Secondary | ICD-10-CM

## 2017-01-08 DIAGNOSIS — Z3490 Encounter for supervision of normal pregnancy, unspecified, unspecified trimester: Secondary | ICD-10-CM

## 2017-01-08 NOTE — Progress Notes (Signed)
TDAP/blood form signed KJ

## 2017-01-08 NOTE — Patient Instructions (Signed)
Third Trimester of Pregnancy The third trimester is from week 28 through week 40 (months 7 through 9). The third trimester is a time when the unborn baby (fetus) is growing rapidly. At the end of the ninth month, the fetus is about 20 inches in length and weighs 6-10 pounds. Body changes during your third trimester Your body will continue to go through many changes during pregnancy. The changes vary from woman to woman. During the third trimester:  Your weight will continue to increase. You can expect to gain 25-35 pounds (11-16 kg) by the end of the pregnancy.  You may begin to get stretch marks on your hips, abdomen, and breasts.  You may urinate more often because the fetus is moving lower into your pelvis and pressing on your bladder.  You may develop or continue to have heartburn. This is caused by increased hormones that slow down muscles in the digestive tract.  You may develop or continue to have constipation because increased hormones slow digestion and cause the muscles that push waste through your intestines to relax.  You may develop hemorrhoids. These are swollen veins (varicose veins) in the rectum that can itch or be painful.  You may develop swollen, bulging veins (varicose veins) in your legs.  You may have increased body aches in the pelvis, back, or thighs. This is due to weight gain and increased hormones that are relaxing your joints.  You may have changes in your hair. These can include thickening of your hair, rapid growth, and changes in texture. Some women also have hair loss during or after pregnancy, or hair that feels dry or thin. Your hair will most likely return to normal after your baby is born.  Your breasts will continue to grow and they will continue to become tender. A yellow fluid (colostrum) may leak from your breasts. This is the first milk you are producing for your baby.  Your belly button may stick out.  You may notice more swelling in your hands,  face, or ankles.  You may have increased tingling or numbness in your hands, arms, and legs. The skin on your belly may also feel numb.  You may feel short of breath because of your expanding uterus.  You may have more problems sleeping. This can be caused by the size of your belly, increased need to urinate, and an increase in your body's metabolism.  You may notice the fetus "dropping," or moving lower in your abdomen (lightening).  You may have increased vaginal discharge.  You may notice your joints feel loose and you may have pain around your pelvic bone.  What to expect at prenatal visits You will have prenatal exams every 2 weeks until week 36. Then you will have weekly prenatal exams. During a routine prenatal visit:  You will be weighed to make sure you and the baby are growing normally.  Your blood pressure will be taken.  Your abdomen will be measured to track your baby's growth.  The fetal heartbeat will be listened to.  Any test results from the previous visit will be discussed.  You may have a cervical check near your due date to see if your cervix has softened or thinned (effaced).  You will be tested for Group B streptococcus. This happens between 35 and 37 weeks.  Your health care provider may ask you:  What your birth plan is.  How you are feeling.  If you are feeling the baby move.  If you have had   any abnormal symptoms, such as leaking fluid, bleeding, severe headaches, or abdominal cramping.  If you are using any tobacco products, including cigarettes, chewing tobacco, and electronic cigarettes.  If you have any questions.  Other tests or screenings that may be performed during your third trimester include:  Blood tests that check for low iron levels (anemia).  Fetal testing to check the health, activity level, and growth of the fetus. Testing is done if you have certain medical conditions or if there are problems during the  pregnancy.  Nonstress test (NST). This test checks the health of your baby to make sure there are no signs of problems, such as the baby not getting enough oxygen. During this test, a belt is placed around your belly. The baby is made to move, and its heart rate is monitored during movement.  What is false labor? False labor is a condition in which you feel small, irregular tightenings of the muscles in the womb (contractions) that usually go away with rest, changing position, or drinking water. These are called Braxton Hicks contractions. Contractions may last for hours, days, or even weeks before true labor sets in. If contractions come at regular intervals, become more frequent, increase in intensity, or become painful, you should see your health care provider. What are the signs of labor?  Abdominal cramps.  Regular contractions that start at 10 minutes apart and become stronger and more frequent with time.  Contractions that start on the top of the uterus and spread down to the lower abdomen and back.  Increased pelvic pressure and dull back pain.  A watery or bloody mucus discharge that comes from the vagina.  Leaking of amniotic fluid. This is also known as your "water breaking." It could be a slow trickle or a gush. Let your health care provider know if it has a color or strange odor. If you have any of these signs, call your health care provider right away, even if it is before your due date. Follow these instructions at home: Medicines  Follow your health care provider's instructions regarding medicine use. Specific medicines may be either safe or unsafe to take during pregnancy.  Take a prenatal vitamin that contains at least 600 micrograms (mcg) of folic acid.  If you develop constipation, try taking a stool softener if your health care provider approves. Eating and drinking  Eat a balanced diet that includes fresh fruits and vegetables, whole grains, good sources of protein  such as meat, eggs, or tofu, and low-fat dairy. Your health care provider will help you determine the amount of weight gain that is right for you.  Avoid raw meat and uncooked cheese. These carry germs that can cause birth defects in the baby.  If you have low calcium intake from food, talk to your health care provider about whether you should take a daily calcium supplement.  Eat four or five small meals rather than three large meals a day.  Limit foods that are high in fat and processed sugars, such as fried and sweet foods.  To prevent constipation: ? Drink enough fluid to keep your urine clear or pale yellow. ? Eat foods that are high in fiber, such as fresh fruits and vegetables, whole grains, and beans. Activity  Exercise only as directed by your health care provider. Most women can continue their usual exercise routine during pregnancy. Try to exercise for 30 minutes at least 5 days a week. Stop exercising if you experience uterine contractions.  Avoid heavy   lifting.  Do not exercise in extreme heat or humidity, or at high altitudes.  Wear low-heel, comfortable shoes.  Practice good posture.  You may continue to have sex unless your health care provider tells you otherwise. Relieving pain and discomfort  Take frequent breaks and rest with your legs elevated if you have leg cramps or low back pain.  Take warm sitz baths to soothe any pain or discomfort caused by hemorrhoids. Use hemorrhoid cream if your health care provider approves.  Wear a good support bra to prevent discomfort from breast tenderness.  If you develop varicose veins: ? Wear support pantyhose or compression stockings as told by your healthcare provider. ? Elevate your feet for 15 minutes, 3-4 times a day. Prenatal care  Write down your questions. Take them to your prenatal visits.  Keep all your prenatal visits as told by your health care provider. This is important. Safety  Wear your seat belt at  all times when driving.  Make a list of emergency phone numbers, including numbers for family, friends, the hospital, and police and fire departments. General instructions  Avoid cat litter boxes and soil used by cats. These carry germs that can cause birth defects in the baby. If you have a cat, ask someone to clean the litter box for you.  Do not travel far distances unless it is absolutely necessary and only with the approval of your health care provider.  Do not use hot tubs, steam rooms, or saunas.  Do not drink alcohol.  Do not use any products that contain nicotine or tobacco, such as cigarettes and e-cigarettes. If you need help quitting, ask your health care provider.  Do not use any medicinal herbs or unprescribed drugs. These chemicals affect the formation and growth of the baby.  Do not douche or use tampons or scented sanitary pads.  Do not cross your legs for long periods of time.  To prepare for the arrival of your baby: ? Take prenatal classes to understand, practice, and ask questions about labor and delivery. ? Make a trial run to the hospital. ? Visit the hospital and tour the maternity area. ? Arrange for maternity or paternity leave through employers. ? Arrange for family and friends to take care of pets while you are in the hospital. ? Purchase a rear-facing car seat and make sure you know how to install it in your car. ? Pack your hospital bag. ? Prepare the baby's nursery. Make sure to remove all pillows and stuffed animals from the baby's crib to prevent suffocation.  Visit your dentist if you have not gone during your pregnancy. Use a soft toothbrush to brush your teeth and be gentle when you floss. Contact a health care provider if:  You are unsure if you are in labor or if your water has broken.  You become dizzy.  You have mild pelvic cramps, pelvic pressure, or nagging pain in your abdominal area.  You have lower back pain.  You have persistent  nausea, vomiting, or diarrhea.  You have an unusual or bad smelling vaginal discharge.  You have pain when you urinate. Get help right away if:  Your water breaks before 37 weeks.  You have regular contractions less than 5 minutes apart before 37 weeks.  You have a fever.  You are leaking fluid from your vagina.  You have spotting or bleeding from your vagina.  You have severe abdominal pain or cramping.  You have rapid weight loss or weight gain.    You have shortness of breath with chest pain.  You notice sudden or extreme swelling of your face, hands, ankles, feet, or legs.  Your baby makes fewer than 10 movements in 2 hours.  You have severe headaches that do not go away when you take medicine.  You have vision changes. Summary  The third trimester is from week 28 through week 40, months 7 through 9. The third trimester is a time when the unborn baby (fetus) is growing rapidly.  During the third trimester, your discomfort may increase as you and your baby continue to gain weight. You may have abdominal, leg, and back pain, sleeping problems, and an increased need to urinate.  During the third trimester your breasts will keep growing and they will continue to become tender. A yellow fluid (colostrum) may leak from your breasts. This is the first milk you are producing for your baby.  False labor is a condition in which you feel small, irregular tightenings of the muscles in the womb (contractions) that eventually go away. These are called Braxton Hicks contractions. Contractions may last for hours, days, or even weeks before true labor sets in.  Signs of labor can include: abdominal cramps; regular contractions that start at 10 minutes apart and become stronger and more frequent with time; watery or bloody mucus discharge that comes from the vagina; increased pelvic pressure and dull back pain; and leaking of amniotic fluid. This information is not intended to replace advice  given to you by your health care provider. Make sure you discuss any questions you have with your health care provider. Document Released: 03/19/2001 Document Revised: 08/31/2015 Document Reviewed: 05/26/2012 Elsevier Interactive Patient Education  2017 [employer]  

## 2017-01-08 NOTE — Progress Notes (Signed)
Routine Prenatal Care Visit  Subjective  Judy Gomez is a 31 y.o. G3P2002 at [redacted]w[redacted]d being seen today for ongoing prenatal care.  She is currently monitored for the following issues for this low-risk pregnancy and has Complex ovarian cyst; Low vitamin D level; and Encounter for supervision of low-risk pregnancy, antepartum on her problem list.  ----------------------------------------------------------------------------------- Patient reports pain in knee and leg from fall in a parking lot on Friday where she landed on her knee. One episode of vomiting last night that resolved with antacids. Contractions: Not present. Vag. Bleeding: None.  Movement: Present. Denies leaking of fluid.  ----------------------------------------------------------------------------------- The following portions of the patient's history were reviewed and updated as appropriate: allergies, current medications, past family history, past medical history, past social history, past surgical history and problem list. Problem list updated.   Objective  Blood pressure 102/68, weight 198 lb (89.8 kg), last menstrual period 04/10/2016. Pregravid weight 162 lb (73.5 kg) Total Weight Gain 36 lb (16.3 kg) Urinalysis:    No sample collected  Fetal Status: Fetal Heart Rate (bpm): 144 Fundal Height: 40 cm Movement: Present     General:  Alert, oriented and cooperative. Patient is in no acute distress.  Skin: Skin is warm and dry. No rash noted. Wound on left knee is scabbed and healing well.  Cardiovascular: Normal heart rate noted  Respiratory: Normal respiratory effort, no problems with respiration noted  Abdomen: Soft, gravid, appropriate for gestational age. Pain/Pressure: Absent     Pelvic:  Cervical exam performed Dilation: Closed Effacement (%): 30 Station: -3  Extremities: Normal range of motion.     Mental Status: Normal mood and affect. Normal behavior. Normal judgment and thought content.     Assessment    31 y.o. [id number] at [redacted]w[redacted]d by  01/15/2017, by Last Menstrual Period presenting for routine prenatal visit.  Plan   pregnancy Problems (from 07/09/16 to present)    Problem Noted Resolved   Encounter for supervision of low-risk pregnancy, antepartum 10/02/2016 by Gae Dry, MD No   Overview Signed 01/08/2017  9:24 AM by Rexene Agent, [place] Prenatal Labs  Dating  Blood type: B/Positive/-- (05/08 1109)   Genetic Screen 1 Screen:    AFP:     Quad:     NIPS: Antibody:Negative (05/08 1109)  Anatomic [place]  Rubella: @RUBELLARESULTSCONSOLE @ Varicella:    GTT Early:               Third trimester:  RPR: Non Reactive (07/26 1135)   Rhogam  HBsAg: Negative (05/08 1109)   TDaP vaccine                       Flu Shot: HIV:     Baby Food                                [id number] (09/26 1636)  Contraception  Pap:  CBB     CS/VBAC    Support Person               Discussed post-dates labor options, and patient's preference is for expectant management. She understands that would need to come to the clinic for bi-weekly monitoring in week 41 if her pregnancy is post-term.  Concerned about exposure to tetanus after wound on knee, Tdap given today.  Term labor symptoms and general obstetric precautions including but not limited to vaginal  bleeding, contractions, leaking of fluid and fetal movement were reviewed in detail with the patient. Please refer to After Visit Summary for other counseling recommendations.   Return in about 1 week (around 01/15/2017) for ROB.  Avel Sensor, CNM 01/08/2017  10:56 AM

## 2017-01-13 ENCOUNTER — Inpatient Hospital Stay
Admission: EM | Admit: 2017-01-13 | Discharge: 2017-01-15 | DRG: 807 | Disposition: A | Discharge status: Home / Self Care | Payer: 59 | Attending: Obstetrics & Gynecology | Admitting: Obstetrics & Gynecology

## 2017-01-13 ENCOUNTER — Encounter: Payer: Self-pay | Admitting: *Deleted

## 2017-01-13 DIAGNOSIS — O4292 Full-term premature rupture of membranes, unspecified as to length of time between rupture and onset of labor: Secondary | ICD-10-CM | POA: Diagnosis present

## 2017-01-13 DIAGNOSIS — N83202 Unspecified ovarian cyst, left side: Secondary | ICD-10-CM | POA: Diagnosis present

## 2017-01-13 DIAGNOSIS — Z3A39 39 weeks gestation of pregnancy: Secondary | ICD-10-CM

## 2017-01-13 DIAGNOSIS — Z3490 Encounter for supervision of normal pregnancy, unspecified, unspecified trimester: Secondary | ICD-10-CM

## 2017-01-13 DIAGNOSIS — O3483 Maternal care for other abnormalities of pelvic organs, third trimester: Principal | ICD-10-CM | POA: Diagnosis present

## 2017-01-13 DIAGNOSIS — O26893 Other specified pregnancy related conditions, third trimester: Secondary | ICD-10-CM | POA: Diagnosis present

## 2017-01-13 DIAGNOSIS — N83299 Other ovarian cyst, unspecified side: Secondary | ICD-10-CM

## 2017-01-13 LAB — CBC
HCT: 37.1 % (ref 35.0–47.0)
Hemoglobin: 12.4 g/dL (ref 12.0–16.0)
MCH: 27.6 pg (ref 26.0–34.0)
MCHC: 33.5 g/dL (ref 32.0–36.0)
MCV: 82.3 fL (ref 80.0–100.0)
PLATELETS: 219 10*3/uL (ref 150–440)
RBC: 4.5 MIL/uL (ref 3.80–5.20)
RDW: 19 % — ABNORMAL HIGH (ref 11.5–14.5)
WBC: 11.4 10*3/uL — AB (ref 3.6–11.0)

## 2017-01-13 LAB — TYPE AND SCREEN
ABO/RH(D): B POS
Antibody Screen: NEGATIVE

## 2017-01-13 MED ORDER — PRENATAL MULTIVITAMIN CH
1.0000 | ORAL_TABLET | Freq: Every day | ORAL | Status: DC
  Administered 2017-01-14 – 2017-01-15 (×2): 1 via ORAL
  Filled 2017-01-13 (×2): qty 1

## 2017-01-13 MED ORDER — ACETAMINOPHEN 325 MG PO TABS: 650.0000 mg | ORAL_TABLET | ORAL | Status: DC | PRN

## 2017-01-13 MED ORDER — OXYTOCIN 40 UNITS IN LACTATED RINGERS INFUSION - SIMPLE MED
1.0000 m[IU]/min | INTRAVENOUS | Status: DC
  Administered 2017-01-13: 2 m[IU]/min via INTRAVENOUS

## 2017-01-13 MED ORDER — WITCH HAZEL-GLYCERIN EX PADS
1.0000 "application " | MEDICATED_PAD | CUTANEOUS | Status: DC | PRN
  Administered 2017-01-13: 1 via TOPICAL

## 2017-01-13 MED ORDER — IBUPROFEN 200 MG PO TABS
600.0000 mg | ORAL_TABLET | Freq: Four times a day (QID) | ORAL | Status: DC
  Administered 2017-01-13 – 2017-01-15 (×8): 600 mg via ORAL
  Filled 2017-01-13 (×7): qty 1

## 2017-01-13 MED ORDER — BUTORPHANOL TARTRATE 1 MG/ML IJ SOLN: 1.0000 mg | INTRAMUSCULAR | Status: DC | PRN

## 2017-01-13 MED ORDER — DIPHENHYDRAMINE HCL 25 MG PO CAPS: 25.0000 mg | ORAL_CAPSULE | Freq: Four times a day (QID) | ORAL | Status: DC | PRN

## 2017-01-13 MED ORDER — ONDANSETRON HCL 4 MG/2ML IJ SOLN
4.0000 mg | Freq: Four times a day (QID) | INTRAMUSCULAR | Status: DC | PRN
Start: 2017-01-13 — End: 2017-01-13

## 2017-01-13 MED ORDER — AMMONIA AROMATIC IN INHA
RESPIRATORY_TRACT | Status: AC
  Filled 2017-01-13: qty 10

## 2017-01-13 MED ORDER — IBUPROFEN 600 MG PO TABS
ORAL_TABLET | ORAL | Status: AC
  Administered 2017-01-13: 600 mg via ORAL
  Filled 2017-01-13: qty 1

## 2017-01-13 MED ORDER — ONDANSETRON HCL 4 MG/2ML IJ SOLN: 4.0000 mg | INTRAMUSCULAR | Status: DC | PRN

## 2017-01-13 MED ORDER — WITCH HAZEL-GLYCERIN EX PADS
MEDICATED_PAD | CUTANEOUS | Status: AC
  Administered 2017-01-13: 1 via TOPICAL
  Filled 2017-01-13: qty 100

## 2017-01-13 MED ORDER — LACTATED RINGERS IV SOLN
INTRAVENOUS | Status: DC
  Administered 2017-01-13: 04:00:00 via INTRAVENOUS

## 2017-01-13 MED ORDER — DIBUCAINE 1 % RE OINT: 1.0000 "application " | TOPICAL_OINTMENT | RECTAL | Status: DC | PRN

## 2017-01-13 MED ORDER — SIMETHICONE 80 MG PO CHEW: 80.0000 mg | CHEWABLE_TABLET | ORAL | Status: DC | PRN

## 2017-01-13 MED ORDER — SODIUM CHLORIDE FLUSH 0.9 % IV SOLN
INTRAVENOUS | Status: AC
  Filled 2017-01-13: qty 10

## 2017-01-13 MED ORDER — OXYCODONE-ACETAMINOPHEN 5-325 MG PO TABS: 1.0000 | ORAL_TABLET | ORAL | Status: DC | PRN

## 2017-01-13 MED ORDER — MISOPROSTOL 200 MCG PO TABS
ORAL_TABLET | ORAL | Status: AC
  Filled 2017-01-13: qty 4

## 2017-01-13 MED ORDER — LACTATED RINGERS IV SOLN: 500.0000 mL | INTRAVENOUS | Status: DC | PRN

## 2017-01-13 MED ORDER — BENZOCAINE-MENTHOL 20-0.5 % EX AERO
1.0000 "application " | INHALATION_SPRAY | CUTANEOUS | Status: DC | PRN
  Administered 2017-01-13 – 2017-01-15 (×2): 1 via TOPICAL
  Filled 2017-01-13: qty 56

## 2017-01-13 MED ORDER — OXYTOCIN 10 UNIT/ML IJ SOLN
INTRAMUSCULAR | Status: AC
  Filled 2017-01-13: qty 2

## 2017-01-13 MED ORDER — SENNOSIDES-DOCUSATE SODIUM 8.6-50 MG PO TABS
2.0000 | ORAL_TABLET | ORAL | Status: DC
  Administered 2017-01-13 – 2017-01-14 (×2): 2 via ORAL
  Filled 2017-01-13 (×2): qty 2

## 2017-01-13 MED ORDER — BENZOCAINE-MENTHOL 20-0.5 % EX AERO
INHALATION_SPRAY | CUTANEOUS | Status: AC
  Administered 2017-01-13: 1 via TOPICAL
  Filled 2017-01-13: qty 56

## 2017-01-13 MED ORDER — OXYTOCIN 40 UNITS IN LACTATED RINGERS INFUSION - SIMPLE MED
2.5000 [IU]/h | INTRAVENOUS | Status: DC
  Filled 2017-01-13: qty 1000

## 2017-01-13 MED ORDER — LIDOCAINE HCL (PF) 1 % IJ SOLN: 30.0000 mL | INTRAMUSCULAR | Status: DC | PRN

## 2017-01-13 MED ORDER — OXYTOCIN BOLUS FROM INFUSION: 500.0000 mL | Freq: Once | INTRAVENOUS | Status: DC

## 2017-01-13 MED ORDER — OXYCODONE-ACETAMINOPHEN 5-325 MG PO TABS: 2.0000 | ORAL_TABLET | ORAL | Status: DC | PRN

## 2017-01-13 MED ORDER — TERBUTALINE SULFATE 1 MG/ML IJ SOLN: 0.2500 mg | Freq: Once | INTRAMUSCULAR | Status: DC | PRN

## 2017-01-13 MED ORDER — ONDANSETRON HCL 4 MG PO TABS: 4.0000 mg | ORAL_TABLET | ORAL | Status: DC | PRN

## 2017-01-13 MED ORDER — LIDOCAINE HCL (PF) 1 % IJ SOLN
INTRAMUSCULAR | Status: AC
  Filled 2017-01-13: qty 30

## 2017-01-13 MED ORDER — COCONUT OIL OIL: 1.0000 "application " | TOPICAL_OIL | Status: DC | PRN

## 2017-01-13 NOTE — OB Triage Note (Signed)
Pt arrived from home with complaints of leaking of fluid since 0200. Pt reports having a large "gush" of fluid. Pt denies contractions but does report some bleeding in the fluid. Pt reports positive fetal movement.

## 2017-01-13 NOTE — Progress Notes (Signed)
  Labor Progress Note   31 y.o. [id number] @ [redacted]w[redacted]d , admitted for  Pregnancy, Labor Management. SROM  Subjective:  Patient is beginning to feel contractions  Objective:  BP 130/84 (BP Location: Right Arm)   Pulse (!) 101   Temp 98.2 F (36.8 C) (Oral)   Resp 16   Ht 5\' 4"  (1.626 m)   Wt 197 lb (89.4 kg)   LMP 04/10/2016   BMI 33.81 kg/m  Abd: mild Extr: trace to 1+ bilateral pedal edema SVE: CERVIX: 3.5 cm dilated, 70 effaced, -2 station  EFM: FHR: 135 bpm, variability: moderate,  accelerations:  Present,  decelerations:  Absent Toco: Frequency: Every 2-3 minutes Labs: I have reviewed the patient's lab results.   Assessment & Plan:  [id number] @ [redacted]w[redacted]d, admitted for  Pregnancy and Labor/Delivery Management  1. Pain management: none. 2. FWB: FHT category I.  3. ID: GBS negative 4. Labor management: cervical sweep, increase activity/walking, consider pitocin if contractions space out  All discussed with patient, see orders  Rod Can, CNM [facility], [facility] 01/13/2017  7:58 AM

## 2017-01-13 NOTE — Discharge Summary (Signed)
OB Discharge Summary     Patient Name: Judy Gomez DOB: 18-Apr-1985 MRN: [id number]  Date of admission: 01/13/2017 Delivering MD: Rexene Agent, CNM  Date of Delivery: 01/13/2017  Date of discharge: 01/15/2017   Admitting diagnosis: 39 Wks, SROM Intrauterine pregnancy: [redacted]w[redacted]d     Secondary diagnosis: None     Discharge diagnosis: Term Pregnancy Delivered, ovarian complex cyst (present on admission)                         Hospital course:  Onset of Labor With Vaginal Delivery     31 y.o. yo Q6P6195 at [redacted]w[redacted]d was admitted in Latent Labor on 01/13/2017. Patient had an uncomplicated labor course as follows:  Membrane Rupture Time/Date: 2:00 AM ,01/13/2017   Intrapartum Procedures: Episiotomy:                                           Lacerations:  2nd degree [3]  Patient had a delivery of a Viable infant. 01/13/2017  Information for the patient's newborn:  Judy, Gomez [[id number]]  Delivery Method: Vag-Spont    [name] had an uncomplicated postpartum course.  She is ambulating, tolerating a regular diet, passing flatus, and urinating well. Patient is discharged home in stable condition on 01/15/17.                                                                  Post partum procedures:none  Complications: None  Physical exam on 01/15/2017: Vitals:   01/14/17 1958 01/14/17 2318 01/15/17 0351 01/15/17 0800  BP: 108/60   102/63  Pulse: 81   69  Resp: 16   18  Temp: 98.3 F (36.8 C) 97.7 F (36.5 C) 98.1 F (36.7 C) (!) 97.5 F (36.4 C)  TempSrc: Oral Oral Oral Oral  SpO2: 100%   100%  Weight:      Height:       General: alert, cooperative and no distress Lochia: appropriate Uterine Fundus: firm Incision: N/A DVT Evaluation: No evidence of DVT seen on physical exam. No cords or calf tenderness.  Labs: Lab Results  Component Value Date   WBC 13.8 (H) 01/14/2017   HGB 12.3 01/14/2017   HCT 36.5 01/14/2017   MCV 82.1 01/14/2017   PLT 227 01/14/2017    No flowsheet data found.  Discharge instruction: per After Visit Summary.  Medications:  Allergies as of 01/15/2017   No Known Allergies     Medication List    TAKE these medications   benzocaine-Menthol 20-0.5 % Aero Commonly known as:  DERMOPLAST Apply 1 application topically as needed for irritation (perineal discomfort).   ferrous sulfate 325 (65 FE) MG EC tablet Take 325 mg by mouth 3 (three) times daily with meals.   ibuprofen 600 MG tablet Commonly known as:  IBU Take 1 tablet (600 mg total) by mouth every 6 (six) hours.   senna-docusate 8.6-50 MG tablet Commonly known as:  Senokot-S Take 2 tablets by mouth daily.            Discharge Care Instructions        Start  Ordered   01/15/17 0000  Discharge wound care:    Comments:  Perform wound care instructions   01/15/17 1115      Diet: routine diet  Activity: Advance as tolerated. Pelvic rest for 6 weeks.   Outpatient follow up: Follow-up Information    Will Bonnet, MD Follow up in 6 week(s).   Specialty:  Obstetrics and Gynecology Why:  schedule pelvic ultrasound at [facility] (order placed as part of discharge orders) and 6 week obstetrical visit right after Contact information: [address] [place] [address] [phone]             Postpartum contraception: Undecided Rhogam Given postpartum: not applicable Rubella vaccine given postpartum: no Varicella vaccine given postpartum: no TDaP given antepartum or postpartum: Yes  Flu Vaccine: Declined  Newborn Data: Live born female  Birth Weight: 8 lb 5.7 oz (3790 g) APGAR: 9, 9  Newborn Delivery   Birth date/time:  01/13/2017 16:50:00 Delivery type:  Vaginal, Spontaneous Delivery      Baby Feeding: Breast  Disposition:home with mother  SIGNED: Prentice Docker, MD 01/15/2017 11:17 AM

## 2017-01-13 NOTE — Progress Notes (Addendum)
  Labor Progress Note   31 y.o. [id number] @ [redacted]w[redacted]d , admitted for Pregnancy, Labor Management. SROM at 0200.  Subjective:  Patient is active and has been walking and using birth ball. Feeling occasional contractions, reporting as mild.  Objective:  BP 130/84 (BP Location: Right Arm)   Pulse (!) 101   Temp 98 F (36.7 C) (Oral)   Resp 16   Ht 5\' 4"  (1.626 m)   Wt 197 lb (89.4 kg)   LMP 04/10/2016   BMI 33.81 kg/m  Abd: mild Extr: trace to 1+ bilateral pedal edema SVE: CERVIX: 3/80/-2 VAGINA: no bleeding MEMBRANES: ruptured  EFM: FHR: 135 bpm, variability: moderate,  accelerations:  Present,  decelerations:  Absent Toco: Patient just returned from walking, mild ctx around every 6 min. Labs: I have reviewed the patient's lab results.   Assessment & Plan:  [id number] @ [redacted]w[redacted]d, admitted for Pregnancy and Labor/Delivery Management  1. Pain management: none. 2. FWB: FHT category I.  3. ID: GBS negative 4. Labor management: Pitocin augmentation indicated as cervix has not changed for the last two exams and contractions are mild and intermittent.  All discussed with patient, see orders  Avel Sensor, CNM 01/13/2017  1:00 PM

## 2017-01-13 NOTE — H&P (Signed)
OB History & Physical   History of Present Illness:  Chief Complaint: gush of fluid at 1:45 this morning  HPI:  Judy Gomez is a 31 y.o. G48P2002 female at [redacted]w[redacted]d dated by LMP.  Her pregnancy has been complicated by left complex ovarian cyst that is stable at 3.5 cm.    She denies contractions.   She reports leakage of fluid.   She reports vaginal bleeding.   She reports fetal movement.    Maternal Medical History:   Past Medical History:  Diagnosis Date  . Ovarian cyst     Past Surgical History:  Procedure Laterality Date  . NO PAST SURGERIES      No Known Allergies  Prior to Admission medications   Medication Sig Start Date End Date Taking? Authorizing Provider  ferrous sulfate 325 (65 FE) MG EC tablet Take 325 mg by mouth 3 (three) times daily with meals.    [provider]    OB History  Gravida Para Term Preterm AB Living  3 2 2     2   SAB TAB Ectopic Multiple Live Births          2    # Outcome Date GA Lbr Len/2nd Weight Sex Delivery Anes PTL Lv  3 Current           2 Term     M Vag-Spont   LIV  1 Term     F Vag-Spont   LIV      Prenatal care site: [facility]  Social History: She  reports that she has never smoked. She has never used smokeless tobacco. She reports that she does not drink alcohol or use drugs.  Family History: family history includes Hypertension in her father.  She does not have a family history of gynecologic cancers  Review of Systems: Negative x 10 systems reviewed except as noted in the HPI.    Physical Exam:  Vital Signs: Temp 98.2 F (36.8 C) (Oral)   Resp 18   Ht 5\' 4"  (1.626 m)   Wt 197 lb (89.4 kg)   LMP 04/10/2016   BMI 33.81 kg/m  Constitutional: Well nourished, well developed female in no acute distress.  HEENT: normal Skin: Warm and dry.  Cardiovascular: Regular rate and rhythm.   Extremity: trace to +1 edema  Respiratory: Clear to auscultation bilateral. Normal respiratory effort Abdomen: FHT  present Back: no CVAT Neuro: DTRs 2+, Cranial nerves grossly intact Psych: Alert and Oriented x3. No memory deficits. Normal mood and affect.  MS: normal gait, normal bilateral lower extremity ROM/strength/stability.  Pelvic exam: (female chaperone present) is not limited by body habitus EGBUS: within normal limits Vagina: within normal limits and with normal mucosa blood in the vault Cervix: 2 cm/60/-3 Nitrazine positive   Pertinent Results:  Prenatal Labs: Blood type/Rh B positive  Antibody screen negative  Rubella Immune  Varicella Immune    RPR Non-reactive  HBsAg negative  HIV negative  GC negative  Chlamydia negative  Genetic screening normal  1 hour GTT 141 0n 10/31/16  3 hour GTT All values wnl  GBS negative on 9/26  TDAP given during this pregnancy 01/08/2017  Baseline FHR: 150 beats/min   Variability: moderate   Accelerations: present   Decelerations: absent Contractions: present frequency: every 2-5 Overall assessment: Category I    Assessment:  Judy Gomez is a 31 y.o. G35P2002 female at [redacted]w[redacted]d with PROM, spontaneous contractions.   Plan:  1. Admit to Labor & Delivery  2. CBC, T&S, Clrs, IVF 3. GBS negative.   4. Fetal well-being: Category I 5. Labor management: augment with pitocin as needed   Rod Can, CNM 01/13/2017 3:33 AM

## 2017-01-14 ENCOUNTER — Encounter: Payer: Self-pay | Admitting: Certified Nurse Midwife

## 2017-01-14 LAB — CBC
HEMATOCRIT: 36.5 % (ref 35.0–47.0)
HEMOGLOBIN: 12.3 g/dL (ref 12.0–16.0)
MCH: 27.7 pg (ref 26.0–34.0)
MCHC: 33.7 g/dL (ref 32.0–36.0)
MCV: 82.1 fL (ref 80.0–100.0)
Platelets: 227 10*3/uL (ref 150–440)
RBC: 4.45 MIL/uL (ref 3.80–5.20)
RDW: 19.1 % — AB (ref 11.5–14.5)
WBC: 13.8 10*3/uL — ABNORMAL HIGH (ref 3.6–11.0)

## 2017-01-14 LAB — RPR: RPR Ser Ql: NONREACTIVE

## 2017-01-14 NOTE — Progress Notes (Signed)
Post Partum Day 1 Subjective: sore all over. Voiding qs. Tolerating regular diet. Breast feeding  Objective: Blood pressure 118/76, pulse 99, temperature 98.5 F (36.9 C), temperature source Oral, resp. rate 18, height 5\' 4"  (1.626 m), weight 89.4 kg (197 lb), last menstrual period 04/10/2016, SpO2 100 %, unknown if currently breastfeeding.  Physical Exam:  General: alert, cooperative and no distress Lochia: appropriate Uterine Fundus: firm/ U-1/ ML/ NT Perineum: C+D+I DVT Evaluation: No evidence of DVT seen on physical exam.   Recent Labs  01/13/17 0353 01/14/17 0445  HGB 12.4 12.3  HCT 37.1 36.5  WBC 11.4* 13.8*  PLT 219 227    Assessment/Plan: Stable PPD#1 Start sitz baths later today Plan for discharge tomorrow  Breast B POS/ RI/ VI TDAP UTD Contraception: ?  Judy Gomez, CNM   LOS: 1 day   Judy Gomez 01/14/2017, 9:16 AM

## 2017-01-15 ENCOUNTER — Encounter: Payer: 59 | Admitting: Maternal Newborn

## 2017-01-15 MED ORDER — SENNOSIDES-DOCUSATE SODIUM 8.6-50 MG PO TABS: 2.0000 | ORAL_TABLET | ORAL | 0 refills | Status: DC

## 2017-01-15 MED ORDER — BENZOCAINE-MENTHOL 20-0.5 % EX AERO: 1.0000 "application " | INHALATION_SPRAY | CUTANEOUS | 1 refills | Status: DC | PRN

## 2017-01-15 MED ORDER — IBUPROFEN 600 MG PO TABS: 600.0000 mg | ORAL_TABLET | Freq: Four times a day (QID) | ORAL | 0 refills | Status: DC

## 2017-01-15 NOTE — Progress Notes (Signed)
Patient discharged home with infant and spouse. Discharge instructions, prescriptions and follow up appointment given to and reviewed with patient and spouse. Patient verbalized understanding. Escorted out via wheelchair by auxiliary.

## 2017-02-20 ENCOUNTER — Encounter: Payer: Self-pay | Admitting: Advanced Practice Midwife

## 2017-02-20 ENCOUNTER — Ambulatory Visit (INDEPENDENT_AMBULATORY_CARE_PROVIDER_SITE_OTHER): Payer: 59 | Admitting: Advanced Practice Midwife

## 2017-02-20 VITALS — BP 118/78 | Wt 180.0 lb

## 2017-02-20 DIAGNOSIS — K648 Other hemorrhoids: Secondary | ICD-10-CM

## 2017-02-20 NOTE — Patient Instructions (Signed)
Hemorrhoids Hemorrhoids are swollen veins in and around the rectum or anus. There are two types of hemorrhoids:  Internal hemorrhoids. These occur in the veins that are just inside the rectum. They may poke through to the outside and become irritated and painful.  External hemorrhoids. These occur in the veins that are outside of the anus and can be felt as a painful swelling or hard lump near the anus.  Most hemorrhoids do not cause serious problems, and they can be managed with home treatments such as diet and lifestyle changes. If home treatments do not help your symptoms, procedures can be done to shrink or remove the hemorrhoids. What are the causes? This condition is caused by increased pressure in the anal area. This pressure may result from various things, including:  Constipation.  Straining to have a bowel movement.  Diarrhea.  Pregnancy.  Obesity.  Sitting for long periods of time.  Heavy lifting or other activity that causes you to strain.  Anal sex.  What are the signs or symptoms? Symptoms of this condition include:  Pain.  Anal itching or irritation.  Rectal bleeding.  Leakage of stool (feces).  Anal swelling.  One or more lumps around the anus.  How is this diagnosed? This condition can often be diagnosed through a visual exam. Other exams or tests may also be done, such as:  Examination of the rectal area with a gloved hand (digital rectal exam).  Examination of the anal canal using a small tube (anoscope).  A blood test, if you have lost a significant amount of blood.  A test to look inside the colon (sigmoidoscopy or colonoscopy).  How is this treated? This condition can usually be treated at home. However, various procedures may be done if dietary changes, lifestyle changes, and other home treatments do not help your symptoms. These procedures can help make the hemorrhoids smaller or remove them completely. Some of these procedures involve  surgery, and others do not. Common procedures include:  Rubber band ligation. Rubber bands are placed at the base of the hemorrhoids to cut off the blood supply to them.  Sclerotherapy. Medicine is injected into the hemorrhoids to shrink them.  Infrared coagulation. A type of light energy is used to get rid of the hemorrhoids.  Hemorrhoidectomy surgery. The hemorrhoids are surgically removed, and the veins that supply them are tied off.  Stapled hemorrhoidopexy surgery. A circular stapling device is used to remove the hemorrhoids and use staples to cut off the blood supply to them.  Follow these instructions at home: Eating and drinking  Eat foods that have a lot of fiber in them, such as whole grains, beans, nuts, fruits, and vegetables. Ask your health care provider about taking products that have added fiber (fiber supplements).  Drink enough fluid to keep your urine clear or pale yellow. Managing pain and swelling  Take warm sitz baths for 20 minutes, 3-4 times a day to ease pain and discomfort.  If directed, apply ice to the affected area. Using ice packs between sitz baths may be helpful. ? Put ice in a plastic bag. ? Place a towel between your skin and the bag. ? Leave the ice on for 20 minutes, 2-3 times a day. General instructions  Take over-the-counter and prescription medicines only as told by your health care provider.  Use medicated creams or suppositories as told.  Exercise regularly.  Go to the bathroom when you have the urge to have a bowel movement. Do not wait.    Avoid straining to have bowel movements.  Keep the anal area dry and clean. Use wet toilet paper or moist towelettes after a bowel movement.  Do not sit on the toilet for long periods of time. This increases blood pooling and pain. Contact a health care provider if:  You have increasing pain and swelling that are not controlled by treatment or medicine.  You have uncontrolled bleeding.  You  have difficulty having a bowel movement, or you are unable to have a bowel movement.  You have pain or inflammation outside the area of the hemorrhoids. This information is not intended to replace advice given to you by your health care provider. Make sure you discuss any questions you have with your health care provider. Document Released: 03/22/2000 Document Revised: 08/23/2015 Document Reviewed: 12/07/2014 Elsevier Interactive Patient Education  2017 [employer]  

## 2017-02-20 NOTE — Progress Notes (Signed)
S: The patient is here today for a problem visit during her 6 week postpartum global period. She has a daily bowel movement. In the past week she has had pain and burning when passing stool. She has also noticed blood when she wipes with the BM. Initially postpartum she was using a stool softener which was working well. When she stopped using the stool softener she began having problems of difficult to pass stool, pain and bleeding. She has been using OTC products such as Preparation H and Tucks Pads. She has noticed some improvement in symptoms but she is still having difficulty. She says it hurts to sit for long periods and she is requesting an extension on her postpartum leave time.   O: Vital Signs: BP 118/78   Wt 180 lb (81.6 kg)   BMI 30.90 kg/m  Constitutional: Well nourished, well developed female in no acute distress.  HEENT: normal Skin: Warm and dry.  Cardiovascular: Regular rate and rhythm.   Respiratory: Clear to auscultation bilateral. Normal respiratory effort Psych: Alert and Oriented x3. No memory deficits. Normal mood and affect.  MS: normal gait, normal bilateral lower extremity ROM/strength/stability.  Pelvic exam:  is not limited by body habitus EGBUS: within normal limits Vagina: not evaluated Cervix: not evaluated Anus: no evidence of external hemorrhoids, very small approximately 2 mm lesion/area of irritation at 11:00 at anus. No current evidence of bleeding.   A: 31 yo female s/p 5 weeks postpartum with anal/rectal discomfort  P: Continue OTC hemorrhoid medications as needed Increase h2o intake Increase fiber intake Increase activity level Do not strain with BM Triple antibiotic ointment to anal lesion Return for next f/u visit on Monday  Rod Can, CNM

## 2017-02-24 ENCOUNTER — Encounter: Payer: Self-pay | Admitting: Obstetrics and Gynecology

## 2017-02-24 ENCOUNTER — Ambulatory Visit (INDEPENDENT_AMBULATORY_CARE_PROVIDER_SITE_OTHER): Payer: 59 | Admitting: Obstetrics and Gynecology

## 2017-02-24 ENCOUNTER — Ambulatory Visit (INDEPENDENT_AMBULATORY_CARE_PROVIDER_SITE_OTHER): Payer: 59

## 2017-02-24 VITALS — BP 118/74 | Ht 64.0 in | Wt 170.0 lb

## 2017-02-24 DIAGNOSIS — N83299 Other ovarian cyst, unspecified side: Secondary | ICD-10-CM

## 2017-02-24 DIAGNOSIS — Z30011 Encounter for initial prescription of contraceptive pills: Secondary | ICD-10-CM | POA: Diagnosis not present

## 2017-02-24 MED ORDER — NORETHINDRONE 0.35 MG PO TABS: 1.0000 | ORAL_TABLET | Freq: Every day | ORAL | 11 refills | Status: DC

## 2017-02-24 NOTE — Addendum Note (Signed)
Addended by: Prentice Docker D on: 02/24/2017 06:49 PM   Modules accepted: Orders

## 2017-02-24 NOTE — Progress Notes (Addendum)
Gynecology Ultrasound Follow Up   Chief Complaint  Patient presents with  . Follow-up    History of Present Illness: Patient is a 31 y.o. female who presents today for ultrasound evaluation of left ovarian cyst .  Ultrasound demonstrates the following findings Adnexa: left ovarian cyst measuring up to 3.6 cm, complex in appearance  Uterus: anteverted with endometrial stripe  11.0 mm Additional: no other pathology seen  She is breastfeeding without issues. She would like a progesterone-only pill while she is still breastfeeding. Her bleeding is absent. Baby is doing well Denies issues with depression symptoms.  Past Medical History:  Diagnosis Date  . Ovarian cyst     Past Surgical History:  Procedure Laterality Date  . NO PAST SURGERIES      Family History  Problem Relation Age of Onset  . Hypertension Father     Social History   Socioeconomic History  . Marital status: Married    Spouse name: Not on file  . Number of children: Not on file  . Years of education: Not on file  . Highest education level: Not on file  Social Needs  . Financial resource strain: Not on file  . Food insecurity - worry: Not on file  . Food insecurity - inability: Not on file  . Transportation needs - medical: Not on file  . Transportation needs - non-medical: Not on file  Occupational History  . Not on file  Tobacco Use  . Smoking status: Never Smoker  . Smokeless tobacco: Never Used  Substance and Sexual Activity  . Alcohol use: No  . Drug use: No  . Sexual activity: Yes    Birth control/protection: None  Other Topics Concern  . Not on file  Social History Narrative  . Not on file    No Known Allergies  Prior to Admission medications   Medication Sig Start Date End Date Taking? Authorizing Provider  benzocaine-Menthol (DERMOPLAST) 20-0.5 % AERO Apply 1 application topically as needed for irritation (perineal discomfort). Patient not taking: Reported on 02/20/2017  01/15/17   Will Bonnet, MD  ferrous sulfate 325 (65 FE) MG EC tablet Take 325 mg by mouth 3 (three) times daily with meals.    [provider]  ibuprofen (IBU) 600 MG tablet Take 1 tablet (600 mg total) by mouth every 6 (six) hours. 01/15/17   Will Bonnet, MD  senna-docusate (SENOKOT-S) 8.6-50 MG tablet Take 2 tablets by mouth daily. Patient not taking: Reported on 02/20/2017 01/16/17   Will Bonnet, MD    Physical Exam BP 118/74   Ht 5\' 4"  (1.626 m)   Wt 170 lb (77.1 kg)   BMI 29.18 kg/m    General: NAD HEENT: normocephalic, anicteric Pulmonary: No increased work of breathing Extremities: no edema, erythema, or tenderness Neurologic: Grossly intact, normal gait Psychiatric: mood appropriate, affect full   Assessment: 31 y.o. [id number] No problem-specific Assessment & Plan notes found for this encounter.   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 It is also unchanged in size since earlier this year  during her pregnancy.  She would like to wait another interval of time since she has had this cyst for the past 4-5 years.  Mutual decision made to remove cyst if still present in 6 months. I discussed that it likely would be and that if this is a teratoma, I can not guarantee it is benign. She voiced understanding and would like to wait six months to repeat the ultrasound.   15 minutes spent in face to face discussion with > 50% spent in counseling, management, and coordination of care of her left ovarian cyst.  Prentice Docker, MD 02/24/2017 6:48 PM

## 2017-04-28 ENCOUNTER — Telehealth: Payer: Self-pay | Admitting: Obstetrics and Gynecology

## 2017-04-28 NOTE — Telephone Encounter (Signed)
Called and left voicemail for patient to call back to be schedule for TVUS and Follow up. 08/24/17

## 2017-09-25 ENCOUNTER — Ambulatory Visit: Payer: 59 | Admitting: Obstetrics and Gynecology

## 2017-09-25 ENCOUNTER — Other Ambulatory Visit: Payer: 59

## 2017-10-15 ENCOUNTER — Other Ambulatory Visit: Payer: Self-pay

## 2017-10-15 ENCOUNTER — Ambulatory Visit: Payer: Self-pay | Admitting: Obstetrics and Gynecology

## 2018-03-10 ENCOUNTER — Encounter: Payer: Self-pay | Admitting: Obstetrics and Gynecology

## 2018-05-18 IMAGING — CT CT ABD-PELV W/ CM
1 of 2 series · 15 of 32 positions shown, 19 images · IV contrast (APPLIED)
Comparison: Pelvic ultrasound 11/14/2015

CLINICAL DATA: 30-year-old female with a possible hemorrhagic
ovarian cyst diagnosed on recent ultrasound.

EXAM:
CT ABDOMEN AND PELVIS WITH CONTRAST
TECHNIQUE: Multidetector CT imaging of the abdomen and pelvis was performed
using the standard protocol following bolus administration of
intravenous contrast.
CONTRAST:  100 mL Esovue-VAA

[Series 3: axial st. · axial · 0.64mm/px · z∈[-1022,-602]mm · 15 of 92 slices shown, 19 images]
[im 4/92  soft-tissue]
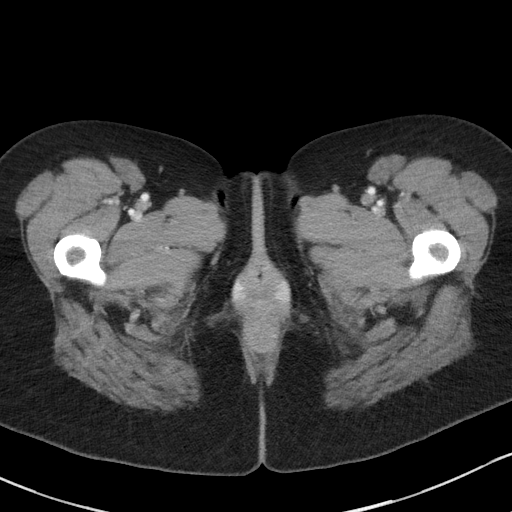
[im 4/92  bone]
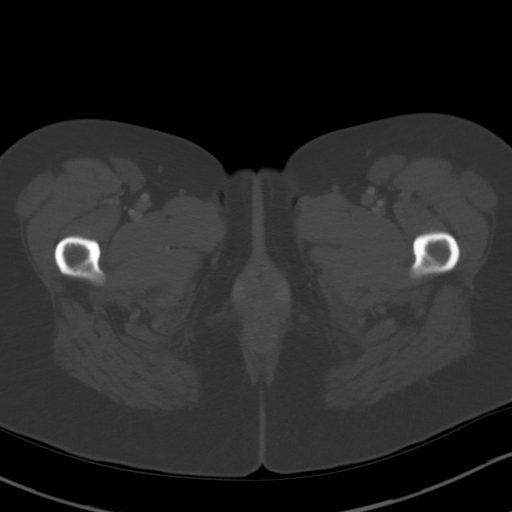
[im 12/92  soft-tissue]
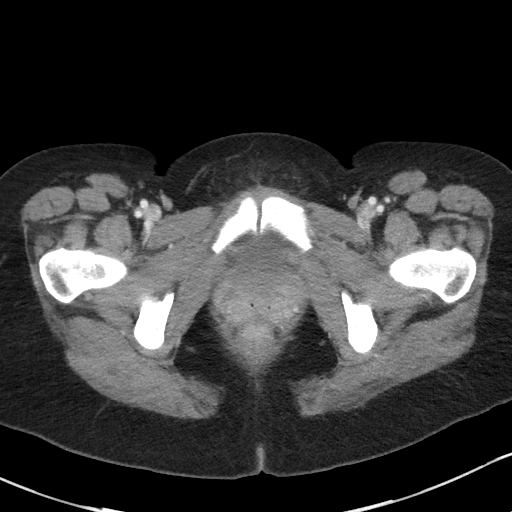
[im 20/92  soft-tissue]
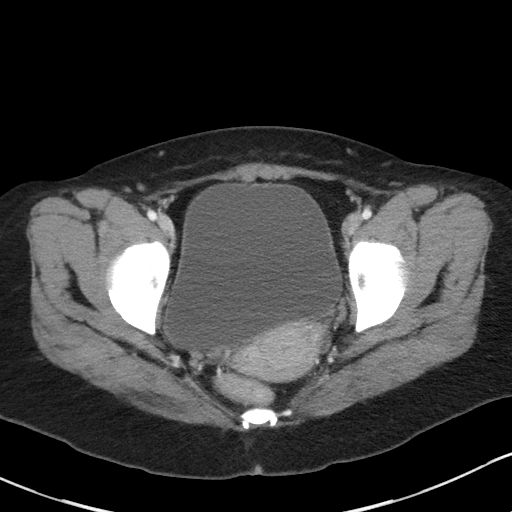
[im 24/92  soft-tissue]
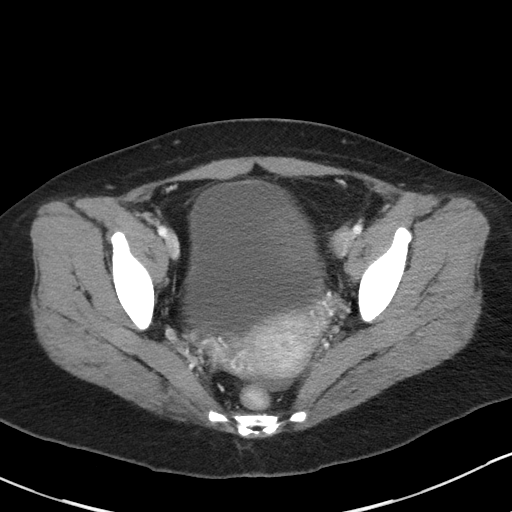
[im 32/92  soft-tissue]
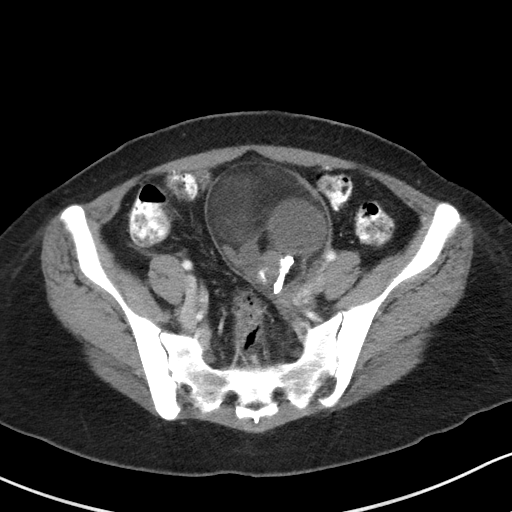
[im 40/92  soft-tissue]
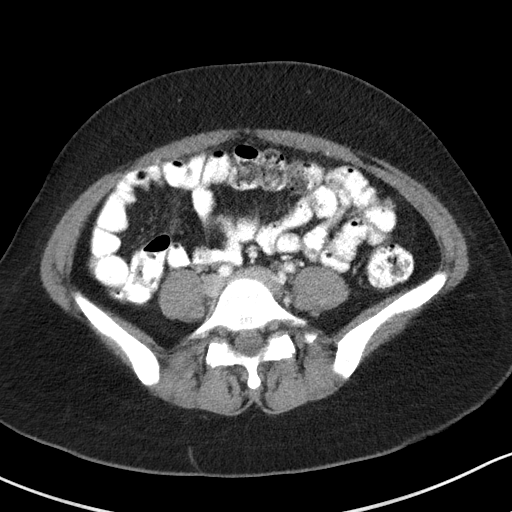
[im 48/92  soft-tissue]
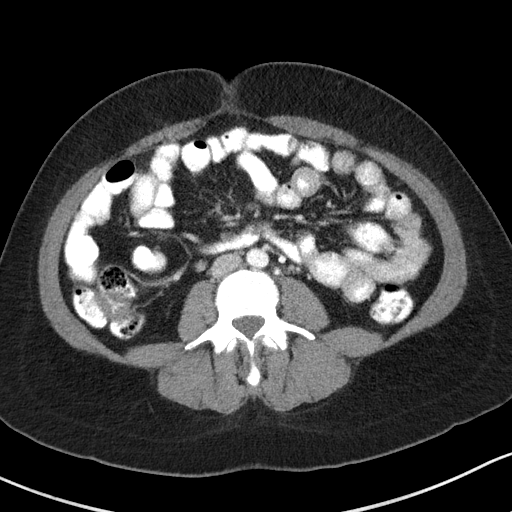
[im 52/92  soft-tissue]
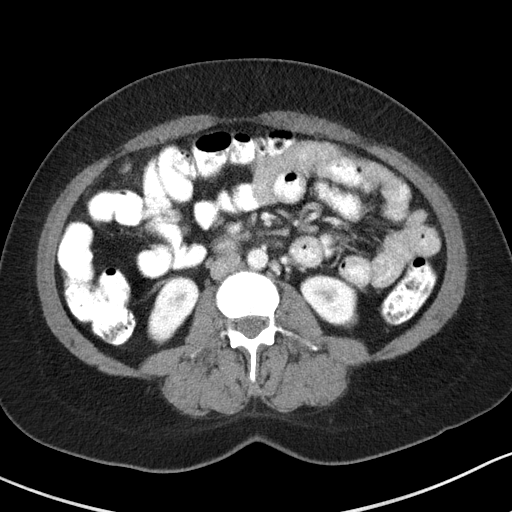
[im 60/92  soft-tissue]
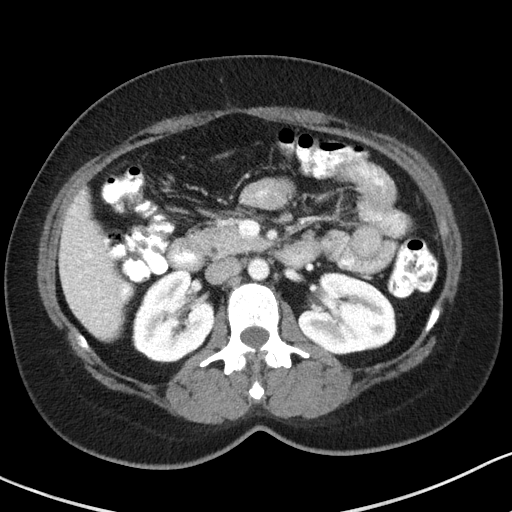
[im 60/92  bone]
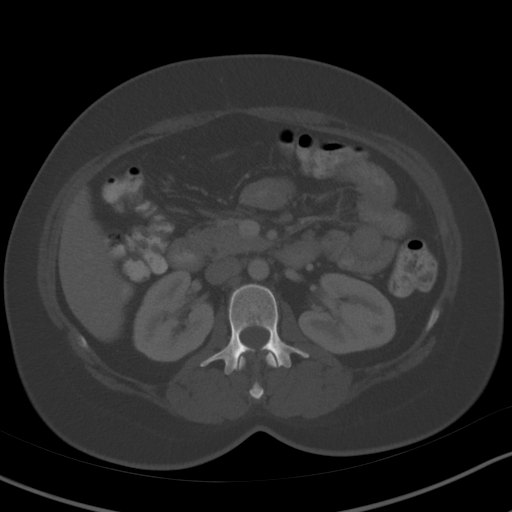
[im 68/92  soft-tissue]
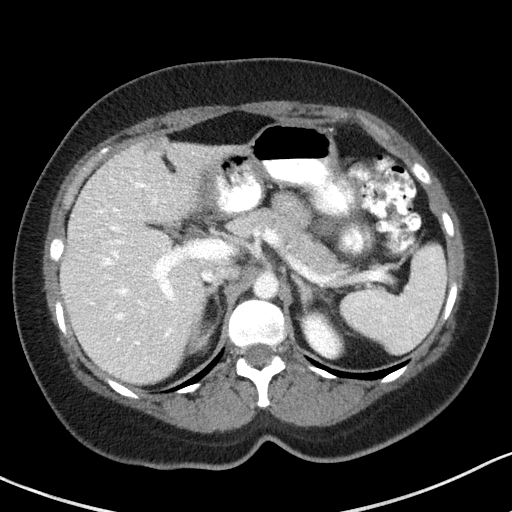
[im 72/92  soft-tissue]
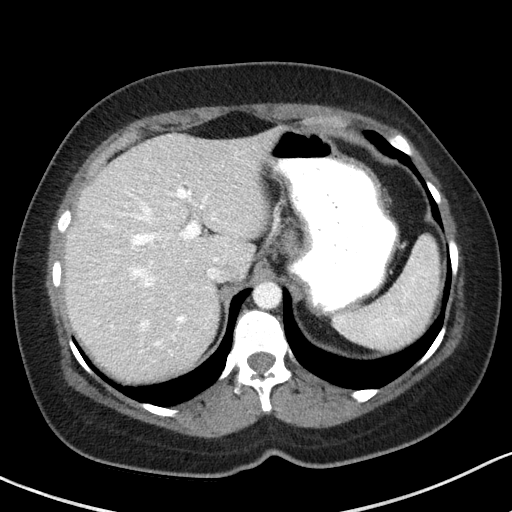
[im 76/92  lung]
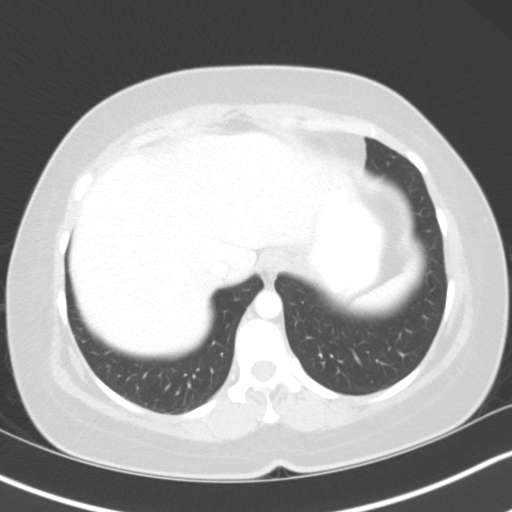
[im 80/92  soft-tissue]
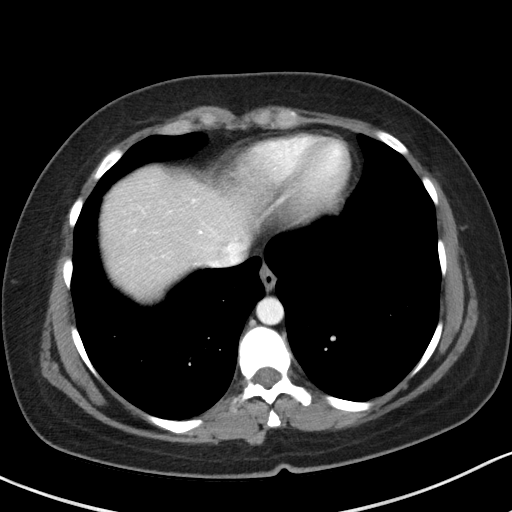
[im 80/92  lung]
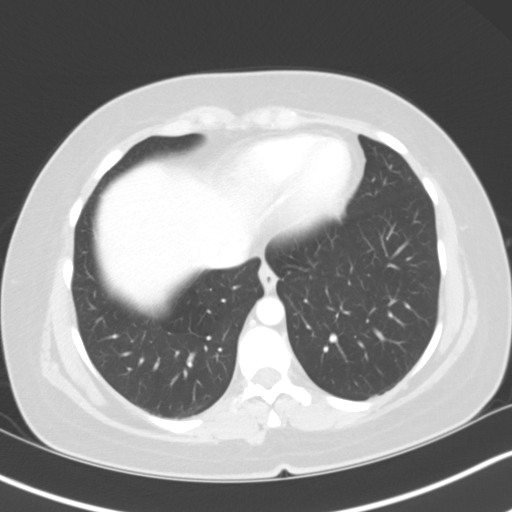
[im 84/92  lung]
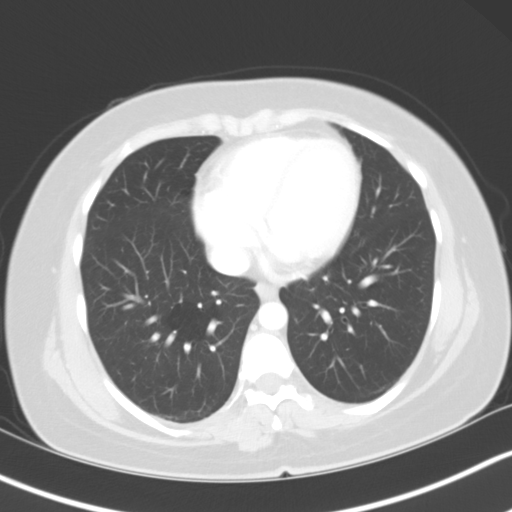
[im 88/92  soft-tissue]
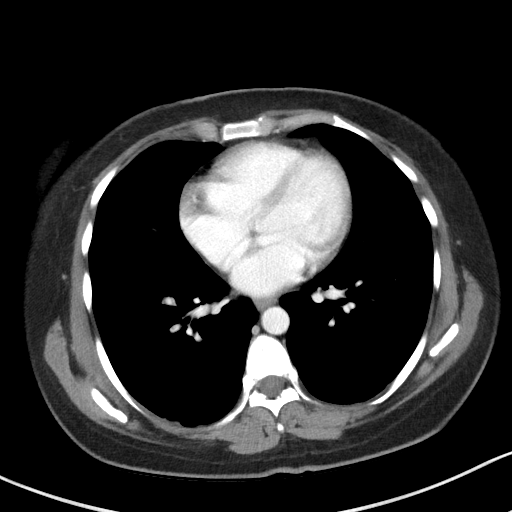
[im 88/92  lung]
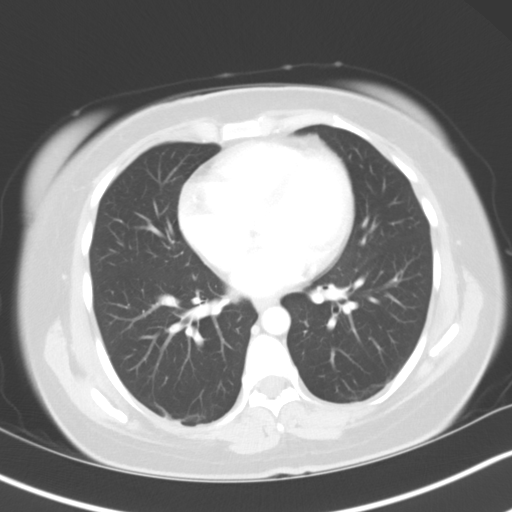

[15 of 32 positions shown; findings below may reference images not displayed]

FINDINGS: Lower chest: No acute findings. Solitary 2 mm subpleural pulmonary
nodule in the periphery of the right lower lobe (image 6 series 5).

Hepatobiliary: No masses or other significant abnormality.

Pancreas: No mass, inflammatory changes, or other significant
abnormality.

Spleen: Within normal limits in size and appearance.

Adrenals/Urinary Tract: No masses identified. No evidence of
hydronephrosis.

Stomach/Bowel: No evidence of obstruction, inflammatory process, or
abnormal fluid collections.

Vascular/Lymphatic: No pathologically enlarged lymph nodes. No
evidence of abdominal aortic aneurysm.

Reproductive: Circumscribed 9.2 x 7.4 x 7.4 cm complex mass arising
from the left ovary. The mass has internal cystic and fatty
components. Additionally, there are fairly well formed teeth within
the posterior and inferior aspect of the mass. The overall
appearance is most consistent with a mature teratoma. The uterus and
right adnexa are unremarkable.

Other: None.

Musculoskeletal:  No suspicious bone lesions identified.
IMPRESSION: 1. Mature cystic ovarian teratoma arising from the left adnexa
measures up to 9.2 cm in diameter.
2. Incidental note is made of a 2 mm right lower lobe subpleural
pulmonary nodule. No follow-up needed if patient is low-risk.
Non-contrast chest CT can be considered in 12 months if patient is
high-risk. This recommendation follows the consensus statement:
Guidelines for Management of Incidental Pulmonary Nodules Detected
on CT Images:From the [HOSPITAL] 9087; published online
before print (10.1148/radiol.0919111179).

## 2018-05-21 IMAGING — US US PELVIS COMPLETE
1 series · 13 of 25 positions shown · non-contrast
Comparison: None

CLINICAL DATA: Reported history of ovarian cyst.

EXAM:
TRANSABDOMINAL AND TRANSVAGINAL ULTRASOUND OF PELVIS
TECHNIQUE: Both transabdominal and transvaginal ultrasound examinations of the
pelvis were performed. Transabdominal technique was performed for
global imaging of the pelvis including uterus, ovaries, adnexal
regions, and pelvic cul-de-sac. It was necessary to proceed with
endovaginal exam following the transabdominal exam to visualize the
endometrium and ovaries.

[Series 1: us pelvis complete · 0.25mm/px · 91 acquisitions, 13 frames shown]
[im 1/91]
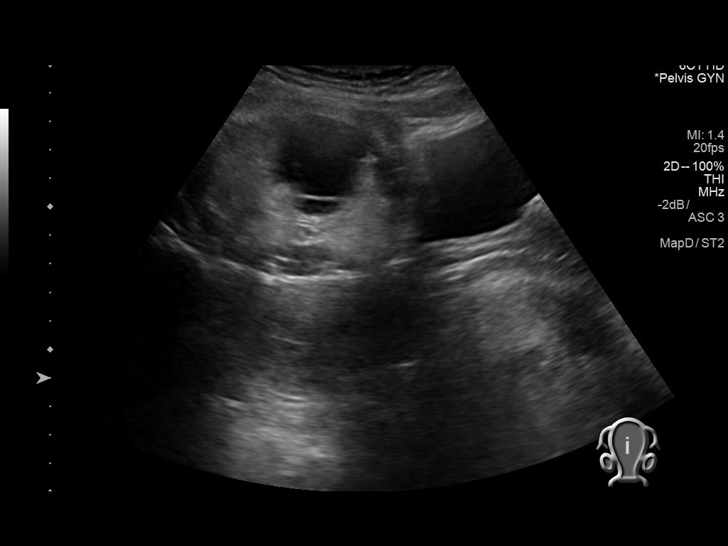
[im 8/91]
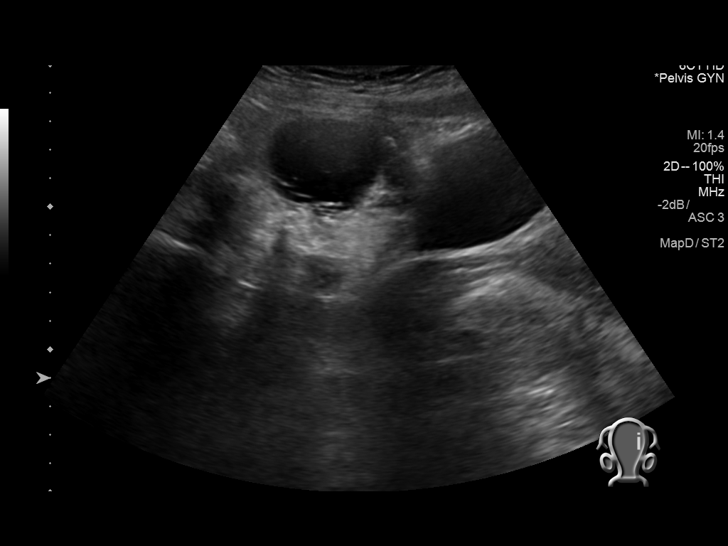
[im 16/91]
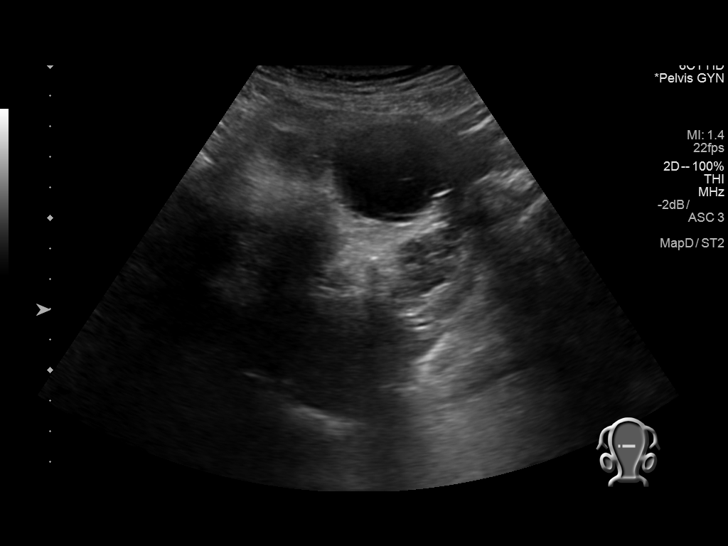
[im 23/91]
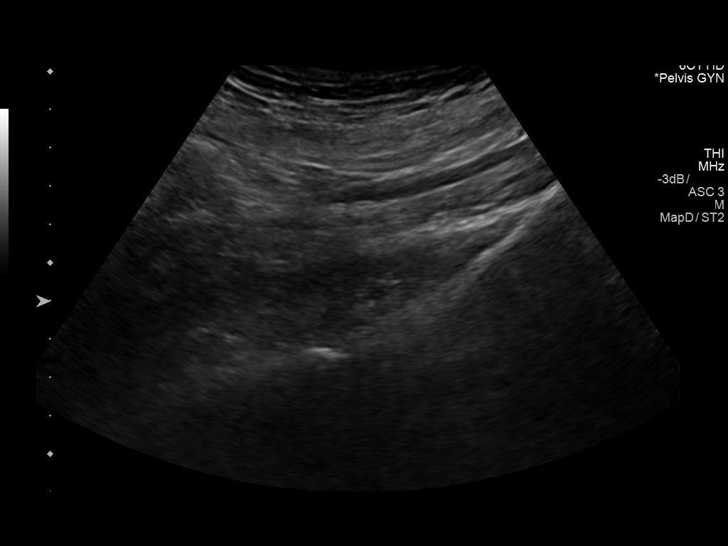
[im 31/91]
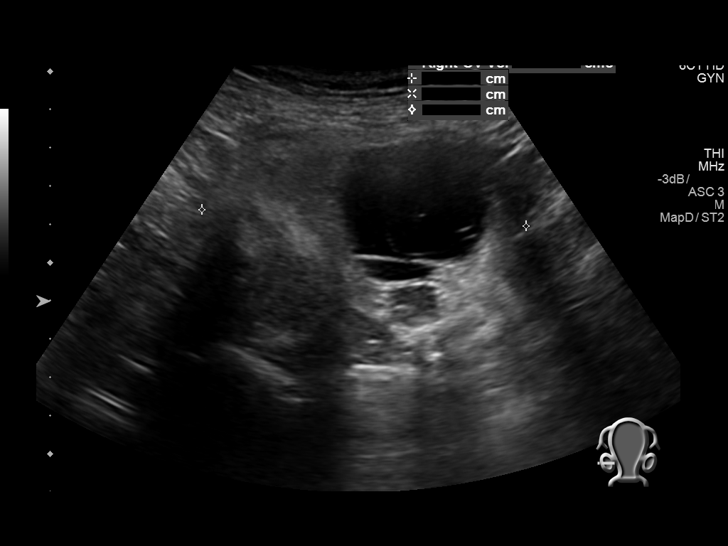
[im 38/91]
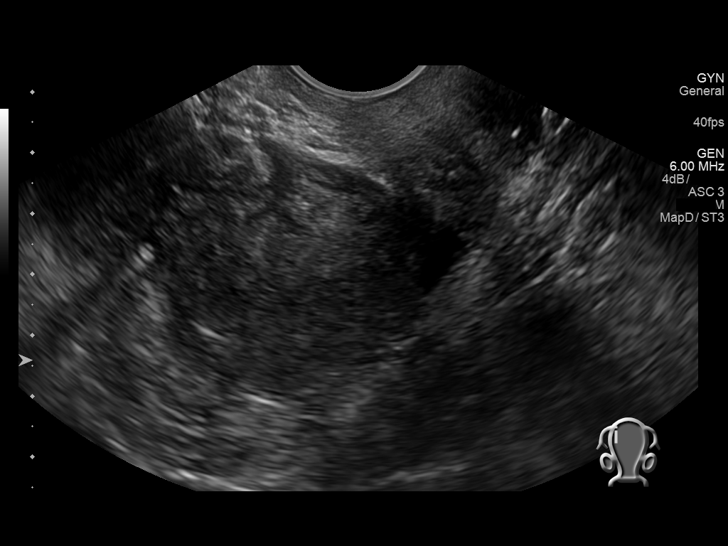
[im 46/91]
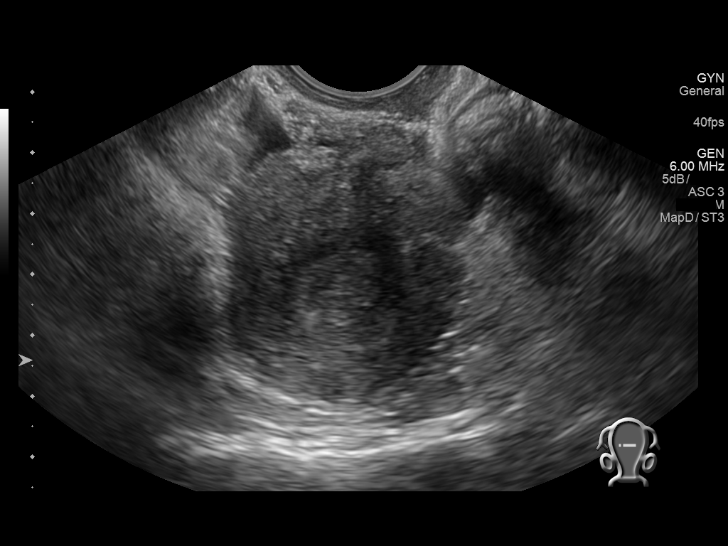
[im 53/91]
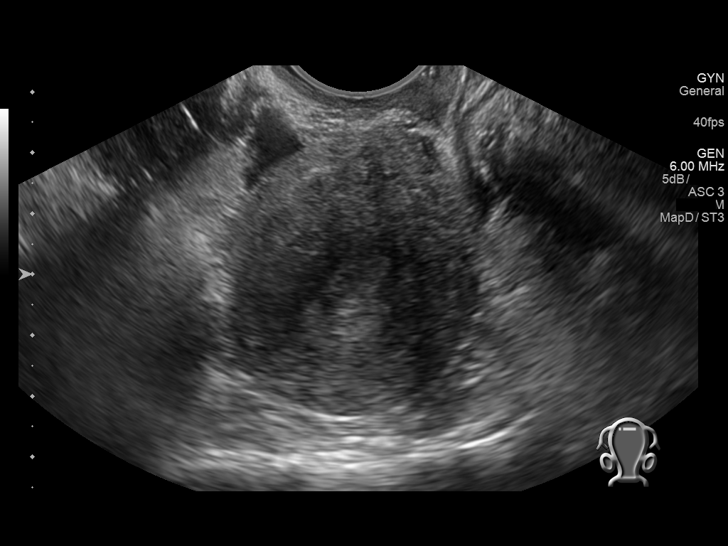
[im 61/91]
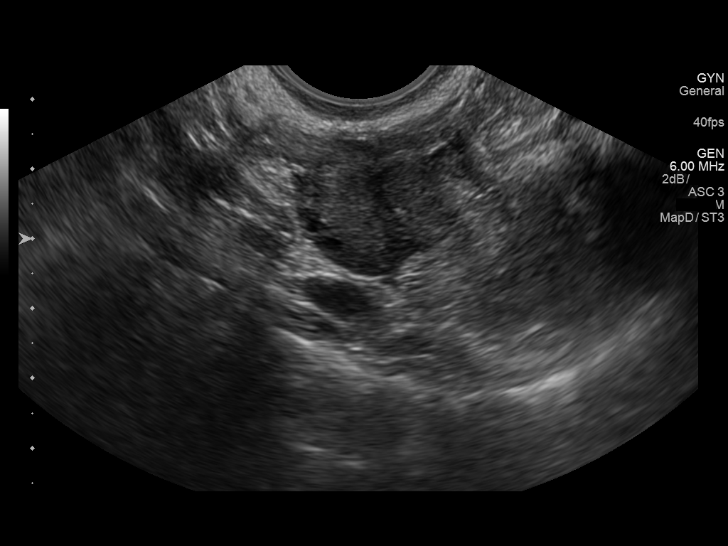
[im 68/91]
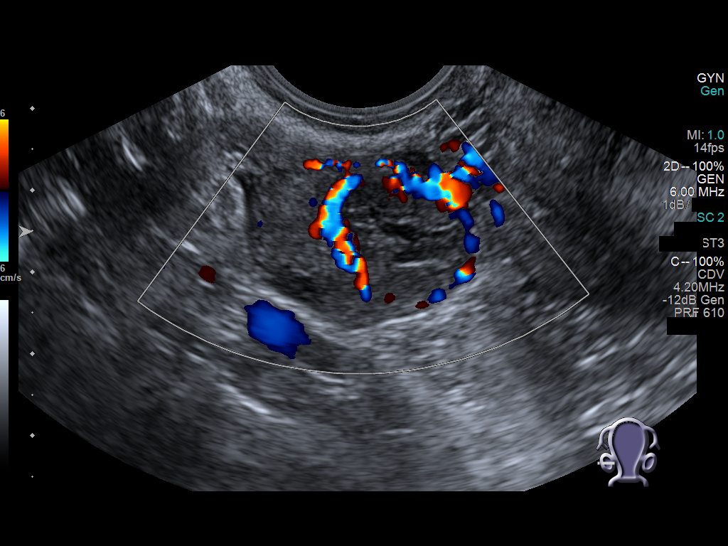
[im 76/91]
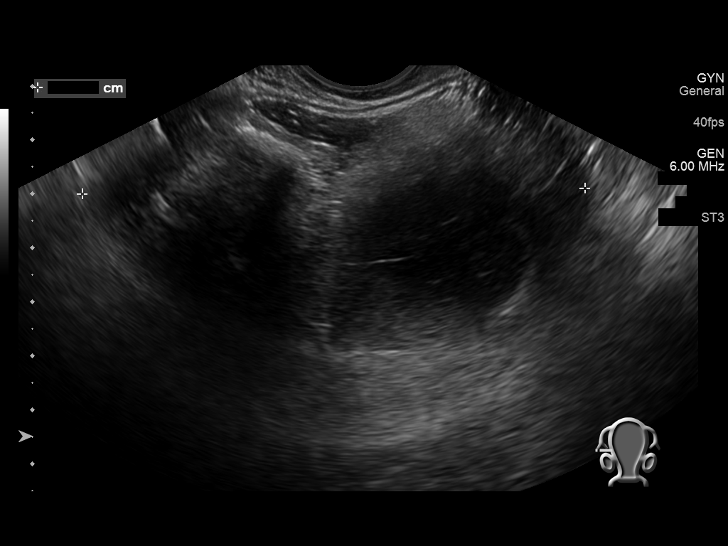
[im 83/91]
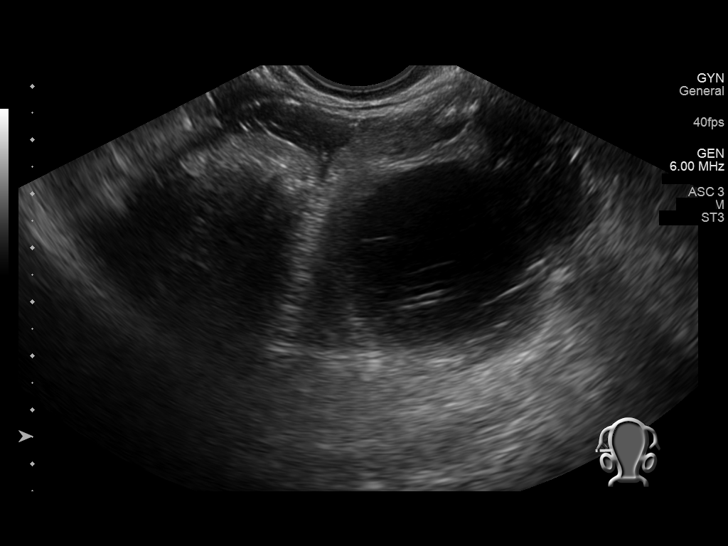
[im 91/91]
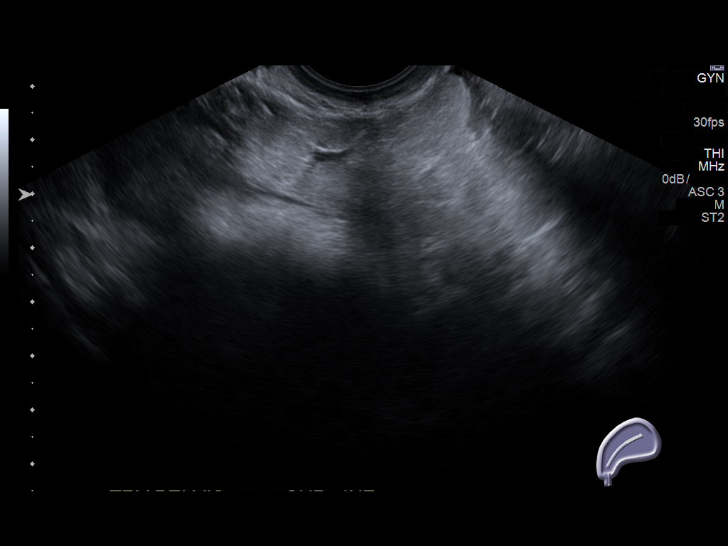

[13 of 25 positions shown; findings below may reference images not displayed]

FINDINGS: Uterus

Measurements: 9.0 x 5.6 x 4.4 cm. No fibroids or other mass
visualized.

Endometrium

Thickness: 10 mm which is within normal limits for patient of
reproductive age. No focal abnormality visualized.

Right ovary

Measurements: 3.5 x 2.8 x 1.8 cm. Complex lesion is seen in right
ovary measuring 2.1 x 2.0 x 1.7 cm.

Left ovary

Not definitively identified. Complex mass measuring 9.3 x 7.6 x
cm is noted anteriorly the uterus and extending into left adnexal
region with significant solid components. This may represent dermoid
lesion, but further evaluation with CT scan or MRI is recommended.

Other findings

Small amount of free fluid is noted which most likely is
physiologic.
IMPRESSION: 2.1 cm complex lesion seen in right ovary which may represent
hemorrhagic cyst. Followup ultrasound in 6-8 weeks is recommended to
ensure resolution.

9.3 cm complex mass is seen anterior to the uterus and extending
into left adnexal region. This may represent dermoid lesion, but
further evaluation with MRI or CT scan of the pelvis is recommended
to evaluate for possible neoplasm or malignancy. These results will
be called to the ordering clinician or representative by the
Radiologist Assistant, and communication documented in the PACS or
zVision Dashboard.

## 2020-01-07 HISTORY — PX: LASIK: SHX215

## 2020-03-29 ENCOUNTER — Telehealth: Payer: Self-pay

## 2020-03-29 NOTE — Telephone Encounter (Signed)
Pt calling; is preg; just got back from the other country; stomach is really upset; what to do?  LMP 01/15/20  [phone]  [facility]

## 2020-03-29 NOTE — Telephone Encounter (Signed)
Adv per protocol immodium, kaopectate, donnegel pg; stay hydrated c clear liquids.  Pt states that's the problem every time she drinks she has diarrhea.  Adv if feels weak, have more that two episodes of diarrhea a day she should go to the hosp.  Clear liq x24hrs, then bland diet x24hrs then work up to reg diet.  Adv should probably see primary care or urgent care since this is from traveling and not preg ins may not pay for [place] to see her.

## 2020-04-13 ENCOUNTER — Encounter: Payer: Self-pay | Admitting: Advanced Practice Midwife

## 2020-04-18 ENCOUNTER — Ambulatory Visit (INDEPENDENT_AMBULATORY_CARE_PROVIDER_SITE_OTHER): Payer: Managed Care, Other (non HMO) | Admitting: Obstetrics

## 2020-04-18 ENCOUNTER — Other Ambulatory Visit: Payer: Self-pay

## 2020-04-18 ENCOUNTER — Encounter: Payer: Self-pay | Admitting: Obstetrics

## 2020-04-18 VITALS — BP 100/62 | HR 68 | Wt 177.0 lb

## 2020-04-18 DIAGNOSIS — Z3480 Encounter for supervision of other normal pregnancy, unspecified trimester: Secondary | ICD-10-CM

## 2020-04-18 DIAGNOSIS — Z3A14 14 weeks gestation of pregnancy: Secondary | ICD-10-CM

## 2020-04-18 DIAGNOSIS — N912 Amenorrhea, unspecified: Secondary | ICD-10-CM | POA: Diagnosis not present

## 2020-04-18 DIAGNOSIS — Z3A13 13 weeks gestation of pregnancy: Secondary | ICD-10-CM

## 2020-04-18 DIAGNOSIS — O0990 Supervision of high risk pregnancy, unspecified, unspecified trimester: Secondary | ICD-10-CM

## 2020-04-18 LAB — POCT URINALYSIS DIPSTICK OB
Appearance: NORMAL
Bilirubin, UA: NEGATIVE
Glucose, UA: NEGATIVE
Ketones, UA: NEGATIVE
Leukocytes, UA: NEGATIVE
Nitrite, UA: NEGATIVE
Odor: NORMAL
POC,PROTEIN,UA: NEGATIVE
Spec Grav, UA: 1.015 (ref 1.010–1.025)
Urobilinogen, UA: 0.2 E.U./dL
pH, UA: 6 (ref 5.0–8.0)

## 2020-04-18 LAB — POCT URINE PREGNANCY: Preg Test, Ur: POSITIVE — AB

## 2020-04-18 MED ORDER — PRENATE MINI 29-0.6-0.4-350 MG PO CAPS: 1.0000 | ORAL_CAPSULE | Freq: Every day | ORAL | 11 refills | Status: DC

## 2020-04-18 NOTE — Progress Notes (Signed)
Ready Set Baby Information given today.

## 2020-04-18 NOTE — Addendum Note (Signed)
Addended by: Meryl Dare on: 04/18/2020 02:35 PM   Modules accepted: Orders

## 2020-04-18 NOTE — Progress Notes (Signed)
New Obstetric Patient H&P    Chief Complaint: "Desires prenatal care"   History of Present Illness: Patient is a 35 y.o. [id number] Not Hispanic or Latino female, LMP unknown (suspects mid October) presents with amenorrhea and positive home pregnancy test. Based on her  LMP, her EDD is Estimated Date of Delivery: 10/21/20 and her EGA is [redacted]w[redacted]d. Cycles are 6. days, regular, and occur approximately every : 28 days. Her last pap smear was about 3 years ago and was no abnormalities.    She had a urine pregnancy test which was positive about 2.5 months month(s)  ago. Her last menstrual period was normal and lasted for  about 6 day(s). Since her LMP she claims she has experienced breast tenderness and previously some nausea . She denies vaginal bleeding. Her past medical history is noncontributory. Her prior pregnancies are notable for none  Since her LMP, she admits to the use of tobacco products  no She claims she has gained   no pounds since the start of her pregnancy.  There are cats in the home in the home  no  She admits close contact with children on a regular basis  yes  She has had chicken pox in the past yes She has had Tuberculosis exposures, symptoms, or previously tested positive for TB   no Current or past history of domestic violence. no  Genetic Screening/Teratology Counseling: (Includes patient, baby's father, or anyone in either family with:)   53. Patient's age >/= 28 at [facility]  no 2. Thalassemia ([place], [place], [place], or Asian background): MCV<80  no 3. Neural tube defect (meningomyelocele, spina bifida, anencephaly)  no 4. Congenital heart defect  no  5. Down syndrome  no 6. Tay-Sachs (Jewish, [place])  no 7. Canavan's Disease  no 8. Sickle cell disease or trait (African)  no  9. Hemophilia or other blood disorders  no  10. Muscular dystrophy  no  11. Cystic fibrosis  no  12. Huntington's Chorea  no  13. Mental retardation/autism  Yes Her son is  autistic 4. Other inherited genetic or chromosomal disorder  no 15. Maternal metabolic disorder (DM, PKU, etc)  no 16. Patient or FOB with a child with a birth defect not listed above no  16a. Patient or FOB with a birth defect themselves no 17. Recurrent pregnancy loss, or stillbirth  no  18. Any medications since LMP other than prenatal vitamins (include vitamins, supplements, OTC meds, drugs, alcohol)  no 19. Any other genetic/environmental exposure to discuss  no  Infection History:   1. Lives with someone with TB or TB exposed  no  2. Patient or partner has history of genital herpes  no 3. Rash or viral illness since LMP  no 4. History of STI (GC, CT, HPV, syphilis, HIV)  no 5. History of recent travel :  Yes she was in [place] for 1.5 months  This Fall  Other pertinent information:  no     Review of Systems:10 point review of systems negative unless otherwise noted in HPI  Past Medical History:  Past Medical History:  Diagnosis Date  . Ovarian cyst     Past Surgical History:  Past Surgical History:  Procedure Laterality Date  . LASIK  01/2020  . NO PAST SURGERIES    . NO PAST SURGERIES      Gynecologic History: Patient's last menstrual period was 01/15/2020 (approximate).  Obstetric History: [id number]  Family History:  Family History  Problem Relation  Age of Onset  . Hypertension Father     Social History:  Social History   Socioeconomic History  . Marital status: Married    Spouse name: Not on file  . Number of children: Not on file  . Years of education: Not on file  . Highest education level: Not on file  Occupational History  . Not on file  Tobacco Use  . Smoking status: Never Smoker  . Smokeless tobacco: Never Used  Substance and Sexual Activity  . Alcohol use: No  . Drug use: No  . Sexual activity: Yes    Birth control/protection: None  Other Topics Concern  . Not on file  Social History Narrative  . Not on file   Social Determinants  of Health   Financial Resource Strain: Not on file  Food Insecurity: Not on file  Transportation Needs: Not on file  Physical Activity: Not on file  Stress: Not on file  Social Connections: Not on file  Intimate Partner Violence: Not on file    Allergies:  No Known Allergies  Medications: Prior to Admission medications   Medication Sig Start Date End Date Taking? Authorizing Provider  benzocaine-Menthol (DERMOPLAST) 20-0.5 % AERO Apply 1 application topically as needed for irritation (perineal discomfort). Patient not taking: No sig reported 01/15/17   Will Bonnet, MD  ferrous sulfate 325 (65 FE) MG EC tablet Take 325 mg by mouth 3 (three) times daily with meals. Patient not taking: Reported on 04/18/2020    [provider]  ibuprofen (IBU) 600 MG tablet Take 1 tablet (600 mg total) by mouth every 6 (six) hours. Patient not taking: Reported on 04/18/2020 01/15/17   Will Bonnet, MD  norethindrone (MICRONOR,CAMILA,ERRIN) 0.35 MG tablet Take 1 tablet (0.35 mg total) daily by mouth. Patient not taking: Reported on 04/18/2020 02/24/17   Will Bonnet, MD  senna-docusate (SENOKOT-S) 8.6-50 MG tablet Take 2 tablets by mouth daily. Patient not taking: No sig reported 01/16/17   Will Bonnet, MD    Physical Exam Vitals: Blood pressure 100/62, pulse 68, weight 177 lb (80.3 kg), last menstrual period 01/15/2020, unknown if currently breastfeeding.  General: NAD HEENT: normocephalic, anicteric Thyroid: no enlargement, no palpable nodules Pulmonary: No increased work of breathing, CTAB Cardiovascular: RRR, distal pulses 2+ Abdomen: NABS, soft, non-tender, non-distended.  Umbilicus without lesions.  No hepatomegaly, splenomegaly or masses palpable. No evidence of hernia  Genitourinary:  External: Normal external female genitalia.  Normal urethral meatus, normal  Bartholin's and Skene's glands.    Vagina: Normal vaginal mucosa, no evidence of prolapse.     Cervix: Grossly normal in appearance, no bleeding  Uterus: anteverted enlarged, mobile, normal contour.  No CMT  Adnexa: ovaries non-enlarged, no adnexal masses  Rectal: deferred Extremities: no edema, erythema, or tenderness Neurologic: Grossly intact Psychiatric: mood appropriate, affect full   Assessment: 35 y.o. G4P3003 at [redacted]w[redacted]d presenting to initiate prenatal care  Plan: 1) Avoid alcoholic beverages. 2) Patient encouraged not to smoke.  3) Discontinue the use of all non-medicinal drugs and chemicals.  4) Take prenatal vitamins daily.  5) Nutrition, food safety (fish, cheese advisories, and high nitrite foods) and exercise discussed. 6) Hospital and practice style discussed with cross coverage system.  7) Genetic Screening, such as with 1st Trimester Screening, cell free fetal DNA, AFP testing, and Ultrasound, as well as with amniocentesis and CVS as appropriate, is discussed with patient. At the conclusion of today's visit patient undecided genetic testing 8) Patient is asked about  travel to areas at risk for the Zika virus, and counseled to avoid travel and exposure to mosquitoes or sexual partners who may have themselves been exposed to the virus. Testing is discussed, and will be ordered as appropriate.  She will need additional guidance re fetal testing at her next visit. Mirna Mires, CNM  04/18/2020 2:30 PM

## 2020-04-18 NOTE — Addendum Note (Signed)
Addended by: Imagene Riches on: 04/18/2020 05:20 PM   Modules accepted: Orders

## 2020-04-20 LAB — URINE CULTURE

## 2020-04-21 LAB — IGP, APTIMA HPV, RFX 16/18,45: HPV Aptima: NEGATIVE

## 2020-04-28 ENCOUNTER — Other Ambulatory Visit: Payer: Self-pay

## 2020-04-28 ENCOUNTER — Ambulatory Visit (INDEPENDENT_AMBULATORY_CARE_PROVIDER_SITE_OTHER): Payer: Managed Care, Other (non HMO) | Admitting: Obstetrics

## 2020-04-28 ENCOUNTER — Ambulatory Visit (INDEPENDENT_AMBULATORY_CARE_PROVIDER_SITE_OTHER): Payer: Managed Care, Other (non HMO)

## 2020-04-28 VITALS — BP 100/70 | Wt 180.0 lb

## 2020-04-28 DIAGNOSIS — Z3480 Encounter for supervision of other normal pregnancy, unspecified trimester: Secondary | ICD-10-CM

## 2020-04-28 DIAGNOSIS — Z3A18 18 weeks gestation of pregnancy: Secondary | ICD-10-CM

## 2020-04-28 DIAGNOSIS — Z1379 Encounter for other screening for genetic and chromosomal anomalies: Secondary | ICD-10-CM

## 2020-04-28 DIAGNOSIS — Z3A14 14 weeks gestation of pregnancy: Secondary | ICD-10-CM

## 2020-04-28 DIAGNOSIS — O0990 Supervision of high risk pregnancy, unspecified, unspecified trimester: Secondary | ICD-10-CM

## 2020-04-28 DIAGNOSIS — Z3A15 15 weeks gestation of pregnancy: Secondary | ICD-10-CM

## 2020-04-28 NOTE — Progress Notes (Signed)
ROB/Dating Scan- declines flu shot, no concerns

## 2020-04-28 NOTE — Progress Notes (Signed)
Routine Prenatal Care Visit  Subjective  Judy Gomez is a 35 y.o. G4P3003 at [redacted]w[redacted]d being seen today for ongoing prenatal care.  She is currently monitored for the following issues for this low-risk pregnancy and has Complex ovarian cyst; Low vitamin D level; and Supervision of other normal pregnancy, antepartum on their problem list.  ----------------------------------------------------------------------------------- Patient reports no complaints.  She had her dating sono today, which confirms her LMP date for EDD  .  .   . Leaking Fluid denies.  ----------------------------------------------------------------------------------- The following portions of the patient's history were reviewed and updated as appropriate: allergies, current medications, past family history, past medical history, past social history, past surgical history and problem list. Problem list updated.  Objective  Blood pressure 100/70, weight 180 lb (81.6 kg), last menstrual period 01/15/2020, unknown if currently breastfeeding. Pregravid weight 174 lb (78.9 kg) Total Weight Gain 6 lb (2.722 kg) Urinalysis: Urine Protein    Urine Glucose    Fetal Status:           General:  Alert, oriented and cooperative. Patient is in no acute distress.  Skin: Skin is warm and dry. No rash noted.   Cardiovascular: Normal heart rate noted  Respiratory: Normal respiratory effort, no problems with respiration noted  Abdomen: Soft, gravid, appropriate for gestational age.       Pelvic:  Cervical exam deferred        Extremities: Normal range of motion.     Mental Status: Normal mood and affect. Normal behavior. Normal judgment and thought content.   Assessment   35 y.o. [id number] at [redacted]w[redacted]d by  10/21/2020, by Last Menstrual Period presenting for routine prenatal visit  Plan   Pregnacy #4 Problems (from 04/18/20 to present)    Problem Noted Resolved   Supervision of other normal pregnancy, antepartum 04/18/2020 by Imagene Riches, CNM No   Overview Addendum 04/28/2020 11:39 AM by Imagene Riches, [facility]  Prenatal Labs  Dating 04/28/2020. CW LMP dating Blood type:     Genetic Screen 1 Screen:    AFP:     Quad:     NIPS: Antibody:   Anatomic [place]  Rubella:    GTT Early:               Third trimester:  RPR:     Flu vaccine  HBsAg:     TDaP vaccine                                               Rhogam: HIV:     Baby Food             Breast                                  GBS: (For PCN allergy, check sensitivities)  Contraception  PVX:YIAX  Circumcision    Pediatrician    Support Person              Previous Version       Preterm labor symptoms and general obstetric precautions including but not limited to vaginal bleeding, contractions, leaking of fluid and fetal movement were reviewed in detail with the patient. Please refer to After Visit Summary for other counseling recommendations.  We reviewed her sono today.  Discussed the anatomy ultrasound for her next visit.  Return in about 4 weeks (around 05/26/2020) for return OB, anatomy sono .  Imagene Riches, CNM  04/28/2020 11:42 AM

## 2020-04-30 LAB — RPR+RH+ABO+RUB AB+AB SCR+CB...
Antibody Screen: NEGATIVE
HIV Screen 4th Generation wRfx: NONREACTIVE
Hematocrit: 37.6 % (ref 34.0–46.6)
Hemoglobin: 12.1 g/dL (ref 11.1–15.9)
Hepatitis B Surface Ag: NEGATIVE
MCH: 27.5 pg (ref 26.6–33.0)
MCHC: 32.2 g/dL (ref 31.5–35.7)
MCV: 86 fL (ref 79–97)
Platelets: 281 10*3/uL (ref 150–450)
RBC: 4.4 x10E6/uL (ref 3.77–5.28)
RDW: 13.8 % (ref 11.7–15.4)
RPR Ser Ql: NONREACTIVE
Rh Factor: POSITIVE
Rubella Antibodies, IGG: 24.3 index (ref >0.99)
Varicella zoster IgG: 1069 index (ref >165)
WBC: 12.7 10*3/uL — ABNORMAL HIGH (ref 3.4–10.8)

## 2020-05-04 LAB — MATERNIT 21 PLUS CORE, BLOOD
Fetal Fraction: 8
Result (T21): NEGATIVE
Trisomy 13 (Patau syndrome): NEGATIVE
Trisomy 18 (Edwards syndrome): NEGATIVE
Trisomy 21 (Down syndrome): NEGATIVE

## 2020-06-02 ENCOUNTER — Ambulatory Visit: Payer: Managed Care, Other (non HMO)

## 2020-06-02 ENCOUNTER — Encounter: Payer: Managed Care, Other (non HMO) | Admitting: Obstetrics and Gynecology

## 2020-06-02 DIAGNOSIS — Z3A18 18 weeks gestation of pregnancy: Secondary | ICD-10-CM

## 2020-06-02 DIAGNOSIS — O0990 Supervision of high risk pregnancy, unspecified, unspecified trimester: Secondary | ICD-10-CM

## 2020-06-05 ENCOUNTER — Ambulatory Visit: Payer: Managed Care, Other (non HMO)

## 2020-06-06 ENCOUNTER — Encounter: Payer: Managed Care, Other (non HMO) | Admitting: Obstetrics and Gynecology

## 2020-06-07 ENCOUNTER — Ambulatory Visit: Admission: RE | Admit: 2020-06-07 | Payer: Managed Care, Other (non HMO) | Source: Ambulatory Visit

## 2020-06-07 ENCOUNTER — Other Ambulatory Visit: Payer: Self-pay | Admitting: Obstetrics

## 2020-06-07 DIAGNOSIS — Z3A20 20 weeks gestation of pregnancy: Secondary | ICD-10-CM

## 2020-06-07 DIAGNOSIS — O0990 Supervision of high risk pregnancy, unspecified, unspecified trimester: Secondary | ICD-10-CM

## 2020-06-07 NOTE — Progress Notes (Signed)
[id number]  

## 2020-06-20 ENCOUNTER — Ambulatory Visit: Payer: Managed Care, Other (non HMO) | Attending: Obstetrics

## 2020-06-20 ENCOUNTER — Ambulatory Visit: Payer: Managed Care, Other (non HMO)

## 2020-06-20 ENCOUNTER — Other Ambulatory Visit: Payer: Self-pay

## 2020-06-21 ENCOUNTER — Encounter: Payer: Managed Care, Other (non HMO) | Admitting: Obstetrics

## 2020-07-13 ENCOUNTER — Telehealth: Payer: Self-pay

## 2020-07-13 NOTE — Telephone Encounter (Signed)
-----   Message from [place] sent at 07/07/2020  4:31 PM EDT ----- Regarding: MFM No Show Please f/u on this patient, who no-showed her MFM appts. Thanks.

## 2020-07-13 NOTE — Telephone Encounter (Signed)
Unable to leave message. VM Full

## 2020-10-03 ENCOUNTER — Ambulatory Visit: Payer: Self-pay | Admitting: Obstetrics and Gynecology

## 2020-12-06 ENCOUNTER — Other Ambulatory Visit: Payer: Self-pay | Admitting: Internal Medicine

## 2020-12-06 ENCOUNTER — Other Ambulatory Visit (HOSPITAL_COMMUNITY): Payer: Self-pay | Admitting: Internal Medicine

## 2020-12-06 DIAGNOSIS — R1010 Upper abdominal pain, unspecified: Secondary | ICD-10-CM

## 2020-12-06 DIAGNOSIS — R5383 Other fatigue: Secondary | ICD-10-CM

## 2020-12-06 DIAGNOSIS — R635 Abnormal weight gain: Secondary | ICD-10-CM

## 2020-12-06 DIAGNOSIS — R7989 Other specified abnormal findings of blood chemistry: Secondary | ICD-10-CM

## 2020-12-06 DIAGNOSIS — E7800 Pure hypercholesterolemia, unspecified: Secondary | ICD-10-CM

## 2020-12-06 DIAGNOSIS — R5381 Other malaise: Secondary | ICD-10-CM

## 2020-12-08 ENCOUNTER — Encounter: Payer: Self-pay | Admitting: Obstetrics and Gynecology

## 2020-12-08 ENCOUNTER — Ambulatory Visit: Payer: Managed Care, Other (non HMO) | Admitting: Obstetrics and Gynecology

## 2020-12-08 ENCOUNTER — Other Ambulatory Visit: Payer: Self-pay

## 2020-12-08 VITALS — BP 122/78 | Ht 64.0 in | Wt 180.0 lb

## 2020-12-08 DIAGNOSIS — N83202 Unspecified ovarian cyst, left side: Secondary | ICD-10-CM | POA: Diagnosis not present

## 2020-12-08 NOTE — Progress Notes (Signed)
Obstetrics & Gynecology Office Visit   Chief Complaint  Patient presents with   Follow-up    History of Present Illness: 35 y.o. [phone] female who presents to discuss an accident that  she had in March-April which resulted in the loss of her pregnancy at about 4 months.  She started trying again about 2-3 months and so far she has not been able to get pregnant.  She is having no symptoms to make her think that something is wrong.   She has resumed having a normal menstrual cycle.  Her LMP was about 1 week ago. Her last pap smear was in 1/22 and it was normal.     Past Medical History:  Diagnosis Date   Ovarian cyst     Past Surgical History:  Procedure Laterality Date   LASIK  01/2020   NO PAST SURGERIES     NO PAST SURGERIES      Gynecologic History: No LMP recorded.  Obstetric History: [id number]  Family History  Problem Relation Age of Onset   Hypertension Father     Social History   Socioeconomic History   Marital status: Married    Spouse name: Not on file   Number of children: Not on file   Years of education: Not on file   Highest education level: Not on file  Occupational History   Not on file  Tobacco Use   Smoking status: Never   Smokeless tobacco: Never  Substance and Sexual Activity   Alcohol use: No   Drug use: No   Sexual activity: Yes    Birth control/protection: None  Other Topics Concern   Not on file  Social History Narrative   Not on file   Social Determinants of Health   Financial Resource Strain: Not on file  Food Insecurity: Not on file  Transportation Needs: Not on file  Physical Activity: Not on file  Stress: Not on file  Social Connections: Not on file  Intimate Partner Violence: Not on file    No Known Allergies  Prior to Admission medications   Medication Sig Start Date End Date Taking? Authorizing Provider  benzocaine-Menthol (DERMOPLAST) 20-0.5 % AERO Apply 1 application topically as needed for irritation (perineal  discomfort). Patient not taking: No sig reported 01/15/17   Will Bonnet, MD  ferrous sulfate 325 (65 FE) MG EC tablet Take 325 mg by mouth 3 (three) times daily with meals. Patient not taking: No sig reported    [provider]  Prenat w/o A-FeCbn-Meth-FA-DHA (PRENATE MINI) 29-0.6-0.4-350 MG CAPS Take 1 tablet by mouth daily. 04/18/20   Imagene Riches, CNM    Review of Systems  Constitutional: Negative.   HENT: Negative.    Eyes: Negative.   Respiratory: Negative.    Cardiovascular: Negative.   Gastrointestinal: Negative.   Genitourinary: Negative.   Musculoskeletal: Negative.   Skin: Negative.   Neurological: Negative.   Psychiatric/Behavioral: Negative.      Physical Exam BP 122/78   Ht '5\' 4"'$  (1.626 m)   Wt 180 lb (81.6 kg)   BMI 30.90 kg/m  No LMP recorded. Physical Exam Constitutional:      General: She is not in acute distress.    Appearance: Normal appearance.  HENT:     Head: Normocephalic and atraumatic.  Eyes:     General: No scleral icterus.    Conjunctiva/sclera: Conjunctivae normal.  Neurological:     General: No focal deficit present.     Mental Status: She is alert  and oriented to person, place, and time.     Cranial Nerves: No cranial nerve deficit.  Psychiatric:        Mood and Affect: Mood normal.        Behavior: Behavior normal.        Judgment: Judgment normal.    Female chaperone present for pelvic and breast  portions of the physical exam  Assessment: 34 y.o. [id number] female here for  1. Cyst of left ovary      Plan: Problem List Items Addressed This Visit   None Visit Diagnoses     Cyst of left ovary    -  Primary   Relevant Orders   US PELVIC COMPLETE WITH TRANSVAGINAL      History of complex left ovarian cyst.  Repeat pelvic ultrasound.   A total of 23 minutes were spent face-to-face with the patient as well as preparation, review, communication, and documentation during this encounter.    Prentice Docker,  MD 12/08/2020 2:21 PM

## 2020-12-20 ENCOUNTER — Ambulatory Visit
Admission: RE | Admit: 2020-12-20 | Discharge: 2020-12-20 | Disposition: A | Discharge status: Home / Self Care | Payer: Managed Care, Other (non HMO) | Source: Ambulatory Visit | Attending: Internal Medicine | Admitting: Internal Medicine

## 2020-12-20 ENCOUNTER — Ambulatory Visit
Admission: RE | Admit: 2020-12-20 | Discharge: 2020-12-20 | Disposition: A | Discharge status: Home / Self Care | Payer: Managed Care, Other (non HMO) | Source: Ambulatory Visit | Attending: Obstetrics and Gynecology | Admitting: Obstetrics and Gynecology

## 2020-12-20 ENCOUNTER — Other Ambulatory Visit: Payer: Self-pay

## 2020-12-20 DIAGNOSIS — R7989 Other specified abnormal findings of blood chemistry: Secondary | ICD-10-CM | POA: Insufficient documentation

## 2020-12-20 DIAGNOSIS — E7800 Pure hypercholesterolemia, unspecified: Secondary | ICD-10-CM | POA: Diagnosis present

## 2020-12-20 DIAGNOSIS — R1010 Upper abdominal pain, unspecified: Secondary | ICD-10-CM | POA: Insufficient documentation

## 2020-12-20 DIAGNOSIS — N83202 Unspecified ovarian cyst, left side: Secondary | ICD-10-CM | POA: Diagnosis present

## 2020-12-20 DIAGNOSIS — R5383 Other fatigue: Secondary | ICD-10-CM | POA: Diagnosis present

## 2020-12-20 DIAGNOSIS — R635 Abnormal weight gain: Secondary | ICD-10-CM | POA: Insufficient documentation

## 2020-12-20 DIAGNOSIS — R5381 Other malaise: Secondary | ICD-10-CM | POA: Insufficient documentation

## 2020-12-25 ENCOUNTER — Ambulatory Visit: Payer: Managed Care, Other (non HMO) | Admitting: Obstetrics and Gynecology

## 2020-12-28 ENCOUNTER — Other Ambulatory Visit: Payer: Self-pay

## 2020-12-28 ENCOUNTER — Ambulatory Visit (INDEPENDENT_AMBULATORY_CARE_PROVIDER_SITE_OTHER): Payer: Managed Care, Other (non HMO) | Admitting: Obstetrics and Gynecology

## 2020-12-28 VITALS — BP 122/74 | Wt 177.0 lb

## 2020-12-28 DIAGNOSIS — N83202 Unspecified ovarian cyst, left side: Secondary | ICD-10-CM | POA: Diagnosis not present

## 2020-12-28 NOTE — Progress Notes (Signed)
Gynecology Ultrasound Follow Up   Chief Complaint  Patient presents with   Follow-up  Ultrasound on 12/20/2020 for left ovarian cyst  History of Present Illness: Patient is a 35 y.o. female who presents today for ultrasound evaluation of the above .  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  This is has grown in size since the CT scan in 2017.  The right ovary appears normal.  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    Past Medical History:  Diagnosis Date   Ovarian cyst     Past Surgical History:  Procedure Laterality Date   LASIK  01/2020   NO PAST SURGERIES     NO PAST SURGERIES      Family History  Problem Relation Age of Onset   Hypertension Father     Social History   Socioeconomic History   Marital status: Married    Spouse name: Not on file   Number of children: Not on file   Years of education: Not on file   Highest education level: Not on file  Occupational History   Not on file  Tobacco Use   Smoking status: Never   Smokeless tobacco: Never  Substance and Sexual Activity   Alcohol use: No   Drug use: No   Sexual activity: Yes    Birth control/protection: None  Other Topics Concern   Not on file  Social History Narrative   Not on file   Social Determinants of Health   Financial Resource Strain: Not on file  Food Insecurity: Not on file  Transportation Needs: Not on file  Physical Activity: Not on file  Stress: Not on file  Social Connections: Not on file  Intimate Partner Violence: Not on file    No Known Allergies  Prior to Admission medications   Medication Sig Start Date End Date Taking? Authorizing Provider   Prenat w/o A-FeCbn-Meth-FA-DHA (PRENATE MINI) 29-0.6-0.4-350 MG CAPS Take 1 tablet by mouth daily. 04/18/20   Imagene Riches, CNM    Physical Exam BP 122/74   Wt 177 lb (80.3 kg)   BMI 30.38 kg/m    General: NAD HEENT: normocephalic, anicteric Pulmonary: No increased work of breathing Extremities: no edema, erythema, or tenderness Neurologic: Grossly intact, normal gait Psychiatric: mood appropriate, affect full  Imaging Results US PELVIC COMPLETE WITH TRANSVAGINAL  Result Date: 12/21/2020 CLINICAL DATA:  Ovarian cyst or teratoma.  History of miscarriage. EXAM: TRANSABDOMINAL AND TRANSVAGINAL ULTRASOUND OF PELVIS TECHNIQUE: Both transabdominal and transvaginal ultrasound examinations of the pelvis were performed. Transabdominal technique was performed for global imaging of the pelvis including uterus, ovaries, adnexal regions, and pelvic cul-de-sac. It was necessary to proceed with endovaginal exam following the transabdominal exam to visualize the endometrium and ovaries. COMPARISON:  CT abdomen pelvis dated 12/05/2015. FINDINGS: Uterus Measurements: 7.1 x 5.1 x 6.2 cm = volume: 118 mL. The uterus is slightly heterogeneous without a focal mass. Endometrium Thickness: 20 mm.  No focal abnormality visualized. Right ovary Measurements: 3.1 x 1.7 x 2.7 cm = volume: 7.7 ML. There is a corpus luteum or complex cyst in the right ovary. Left ovary The left ovary is poorly visualized. There is a 12  x 8 x 11 cm complex and heterogeneous mass with mixed echogenicity in keeping with no teratoma. This is however suboptimally visualized and evaluated on this ultrasound due to large size. This however appears to have grown since the CT of 12/05/2015. CT or MRI may provide better evaluation. Other findings Small free fluid in the pelvis. IMPRESSION: 1. Large heterogeneous mass extending from the left adnexa into the anterior pelvis in keeping with previously known teratoma. This may have increased in size  since the prior CT. 2. Slightly heterogeneous uterus. 3. Unremarkable endometrium and right ovary. Electronically Signed   By: Anner Crete M.D.   On: 12/21/2020 01:06   Assessment: 35 y.o. [id number]  1. Cyst of left ovary      Plan: Problem List Items Addressed This Visit   None Visit Diagnoses     Cyst of left ovary    -  Primary      We had a long discussion about removing the left ovary with the cyst.  She is trying to get pregnant. We discussed that she could attempt pregnancy and would be likely to be successful. However, with a cyst of that size, there would be an increased risk of ovarian torsion during her pregnancy, which might prompt the need for surgery while pregnant.  We also discussed removing the cyst, allowing for recovery, then having her attempt pregnancy.  So, she would have to delay pregnancy somewhat.  We discussed approaches to surgery, such as laparoscopic versus laparotomy.  She would like to consider and she will get back to me.  We discussed that the cyst has increased in size, albeit slowly over the past 5 years and that this type of cyst does not typically resolve.  The longer we wait to remove the cyst, the more likely that a minimally invasive approach will be not an option.    A total of 23 minutes were spent face-to-face with the patient as well as preparation, review, communication, and documentation during this encounter.    Prentice Docker, MD, [facility], [facility] 12/28/2020 4:35 PM

## 2020-12-31 ENCOUNTER — Encounter: Payer: Self-pay | Admitting: Obstetrics and Gynecology

## 2021-07-03 ENCOUNTER — Other Ambulatory Visit: Payer: Self-pay | Admitting: Obstetrics and Gynecology

## 2021-07-09 ENCOUNTER — Other Ambulatory Visit: Payer: Managed Care, Other (non HMO)

## 2021-07-23 NOTE — H&P (Addendum)
Ms. Camposano is a 36 y.o. female here for Rf Dr Joanie Coddington ovarian cyst ?.pt is here for consultation for left ovarian cyst that has been documented and growing since 2013 . No pain  ?Last ctscan and u/s 12/2020. 12x11 cm complex left ovarian cyst  ?G4P3  ? also since her last pregnancy that ended in a TAB( reportedly uncomplicated) ?She has been unable to conceive ( 1 yr) ?Menses are regular ?No STI  ?No significant medical problems, nor for husband ?No pelvic infections or surgeries ?  ?U/s repeat today : ?  ?Uterus anteverted ?  ?Fluid filled endometrium=14.25m; walls:0.36 + 0.39cm=0.75cm(7.582m ?  ?Rt complex septated ovarian cyst=2.1cm; septation=0.35cm ?Lt complex ovarian mass=13.58 x 8.24 x 11.95cm ?Lt ovary not definitely seen ?Free fluid seen in PCDS ?  ?  ?Past Medical History:  has no past medical history on file.  ?Past Surgical History:  has no past surgical history on file. ?Family History: family history includes Arthritis in her mother; High blood pressure (Hypertension) in her father. ?Social History:  reports that she has never smoked. She has never used smokeless tobacco. She reports that she does not drink alcohol and does not use drugs. ?OB/GYN History:  ?OB History   ?No obstetric history on file. ?   ?  ?  ?Allergies: has No Known Allergies. ?Medications: ?  ?Current Outpatient Medications:  ?  ergocalciferol, vitamin D2, 1,250 mcg (50,000 unit) capsule, Take 1 capsule (50,000 Units total) by mouth once a week for 30 days, Disp: 13 capsule, Rfl: 3 ?  rosuvastatin (CRESTOR) 5 MG tablet, Take 1 tablet (5 mg total) by mouth once daily, Disp: 90 tablet, Rfl: 1 ?  ?Review of Systems: ?General:                      No fatigue or weight loss ?Eyes:                           No vision changes ?Ears:                            No hearing difficulty ?Respiratory:                No cough or shortness of breath ?Pulmonary:                  No asthma or shortness of breath ?Cardiovascular:           No  chest pain, palpitations, dyspnea on exertion ?Gastrointestinal:          No abdominal bloating, chronic diarrhea, constipations, masses, pain or hematochezia ?Genitourinary:             No hematuria, dysuria, abnormal vaginal discharge, pelvic pain, Menometrorrhagia ?Lymphatic:                   No swollen lymph nodes ?Musculoskeletal:         No muscle weakness ?Neurologic:                  No extremity weakness, syncope, seizure disorder ?Psychiatric:                  No history of depression, delusions or suicidal/homicidal ideation ?  ?  ? Exam:  ?  ?   ?Vitals:  ?  07/24/21  0843  ?BP: 106/75  ?Pulse: 88  ?  ?  ?  Body mass index is 30.62 kg/m?. ?  ?WDWN female in NAD   ?Lungs: CTA  ?CV : RRR without murmur   ?  ?Neck:  no thyromegaly ?Abdomen: soft , no mass, normal active bowel sounds,  non-tender, no rebound tenderness ?Pelvic: tanner stage 5 ,  ?External genitalia: vulva /labia no lesions ?Urethra: no prolapse ?Vagina: normal physiologic d/c ?Cervix: no lesions, no cervical motion tenderness   ?Uterus: normal size shape and contour, non-tender ?Adnexa:smooth, mobile 12 cm mass right anterior to UTX   ?Rectovaginal:  ?  ?Impression:  ?  ?The primary encounter diagnosis was Left ovarian cyst. Diagnoses of Secondary female infertility and Ovarian mass were also pertinent to this visit. ?Appearance c/w a dermoid  ?  ?  ?  ?Plan:  ?  ?Spoke to her about my recommendation for left  ovarian cystectomy , possible oophorectomy . Scheduling is submitted . The procedure and post op is discussed  ?Ca-125 ? risks of the procedure d/w the pt . See [place]  notes ?

## 2021-07-31 ENCOUNTER — Other Ambulatory Visit: Payer: Self-pay

## 2021-07-31 ENCOUNTER — Encounter
Admission: RE | Admit: 2021-07-31 | Discharge: 2021-07-31 | Disposition: A | Discharge status: Home / Self Care | Payer: Managed Care, Other (non HMO) | Source: Ambulatory Visit | Attending: Obstetrics and Gynecology | Admitting: Obstetrics and Gynecology

## 2021-07-31 HISTORY — DX: Pure hypercholesterolemia, unspecified: E78.00

## 2021-07-31 HISTORY — DX: Other specified abnormal findings of blood chemistry: R79.89

## 2021-07-31 NOTE — Patient Instructions (Addendum)
Your procedure is scheduled on: Thursday 08/09/21 ?Report to the Registration Desk on the 1st floor of the [facility]. ?To find out your arrival time, please call [phone] between 1PM - 3PM on: Wednesday 08/08/21 ? ?REMEMBER: ?Instructions that are not followed completely may result in serious medical risk, up to and including death; or upon the discretion of your surgeon and anesthesiologist your surgery may need to be rescheduled. ? ?Do not eat food after midnight the night before surgery.  ?No gum chewing, lozengers or hard candies. ? ?You may however, drink CLEAR liquids up to 2 hours before you are scheduled to arrive for your surgery. Do not drink anything within 2 hours of your scheduled arrival time. ? ?Clear liquids include: ?- water  ?- apple juice without pulp ?- gatorade (not RED colors) ?- black coffee or tea (Do NOT add milk or creamers to the coffee or tea) ?Do NOT drink anything that is not on this list. ? ?In addition, your doctor has ordered for you to drink the provided  ?Ensure Pre-Surgery Clear Carbohydrate Drink  ?Drinking this carbohydrate drink up to two hours before surgery helps to reduce insulin resistance and improve patient outcomes. Please complete drinking 2 hours prior to scheduled arrival time. ? ?TAKE THESE MEDICATIONS THE MORNING OF SURGERY WITH A SIP OF WATER: ?NONE ? ?One week prior to surgery: ?Stop Anti-inflammatories (NSAIDS) such as Advil, Aleve, Ibuprofen, Motrin, Naproxen, Naprosyn and Aspirin based products such as Excedrin, Goodys Powder, BC Powder. ? ?Stop taking your ANY OVER THE COUNTER supplements until after surgery. ? ?You may however, continue to take Tylenol if needed for pain up until the day of surgery. ? ?No Alcohol for 24 hours before or after surgery. ? ?No Smoking including e-cigarettes for 24 hours prior to surgery.  ?No chewable tobacco products for at least 6 hours prior to surgery.  ?No nicotine patches on the day of surgery. ? ?Do not use any  "recreational" drugs for at least a week prior to your surgery.  ?Please be advised that the combination of cocaine and anesthesia may have negative outcomes, up to and including death. ?If you test positive for cocaine, your surgery will be cancelled. ? ?On the morning of surgery brush your teeth with toothpaste and water, you may rinse your mouth with mouthwash if you wish. ?Do not swallow any toothpaste or mouthwash. ? ?Use CHG Soap as directed on instruction sheet. ? ?Do not wear jewelry, make-up, hairpins, clips or nail polish. ? ?Do not wear lotions, powders, or perfumes.  ? ?Do not shave body from the neck down 48 hours prior to surgery just in case you cut yourself which could leave a site for infection.  ?Also, freshly shaved skin may become irritated if using the CHG soap. ? ?Do not bring valuables to the hospital. [facility] is not responsible for any missing/lost belongings or valuables.  ? ?Notify your doctor if there is any change in your medical condition (cold, fever, infection). ? ?Wear comfortable clothing (specific to your surgery type) to the hospital. ? ?After surgery, you can help prevent lung complications by doing breathing exercises.  ?Take deep breaths and cough every 1-2 hours. Your doctor may order a device called an Incentive Spirometer to help you take deep breaths. ?When coughing or sneezing, hold a pillow firmly against your incision with both hands. This is called ?splinting.? Doing this helps protect your incision. It also decreases belly discomfort. ? ?If you are being admitted to the  hospital overnight, leave your suitcase in the car. ?After surgery it may be brought to your room. ? ?If you are being discharged the day of surgery, you will not be allowed to drive home. ?You will need a responsible adult (18 years or older) to drive you home and stay with you that night.  ? ?If you are taking public transportation, you will need to have a responsible adult (18 years or older) with  you. ?Please confirm with your physician that it is acceptable to use public transportation.  ? ?Please call the [facility] at [phone] if you have any questions about these instructions. ? ?Surgery Visitation Policy: ? ?Patients undergoing a surgery or procedure may have two family members or support persons with them as long as the person is not COVID-19 positive or experiencing its symptoms.  ? ?Inpatient Visitation:   ? ?Visiting hours are 7 a.m. to 8 p.m. ?Up to four visitors are allowed at one time in a patient room, including children. The visitors may rotate out with other people during the day. One designated support person (adult) may remain overnight.  ?

## 2021-08-03 ENCOUNTER — Encounter
Admission: RE | Admit: 2021-08-03 | Discharge: 2021-08-03 | Disposition: A | Discharge status: Home / Self Care | Payer: Managed Care, Other (non HMO) | Source: Ambulatory Visit | Attending: Obstetrics and Gynecology | Admitting: Obstetrics and Gynecology

## 2021-08-03 ENCOUNTER — Encounter: Payer: Self-pay | Admitting: Urgent Care

## 2021-08-03 DIAGNOSIS — Z01812 Encounter for preprocedural laboratory examination: Secondary | ICD-10-CM | POA: Diagnosis present

## 2021-08-03 DIAGNOSIS — Z01818 Encounter for other preprocedural examination: Secondary | ICD-10-CM

## 2021-08-03 LAB — BASIC METABOLIC PANEL
Anion gap: 5 (ref 5–15)
BUN: 18 mg/dL (ref 6–20)
CO2: 24 mmol/L (ref 22–32)
Calcium: 8.7 mg/dL — ABNORMAL LOW (ref 8.9–10.3)
Chloride: 107 mmol/L (ref 98–111)
Creatinine, Ser: 0.63 mg/dL (ref 0.44–1.00)
GFR, Estimated: >60 mL/min (ref >60)
Glucose, Bld: 121 mg/dL — ABNORMAL HIGH (ref 70–99)
Potassium: 3.9 mmol/L (ref 3.5–5.1)
Sodium: 136 mmol/L (ref 135–145)

## 2021-08-03 LAB — CBC
HCT: 39.8 % (ref 36.0–46.0)
Hemoglobin: 13 g/dL (ref 12.0–15.0)
MCH: 26.2 pg (ref 26.0–34.0)
MCHC: 32.7 g/dL (ref 30.0–36.0)
MCV: 80.1 fL (ref 80.0–100.0)
Platelets: 333 10*3/uL (ref 150–400)
RBC: 4.97 MIL/uL (ref 3.87–5.11)
RDW: 14.1 % (ref 11.5–15.5)
WBC: 8.5 10*3/uL (ref 4.0–10.5)
nRBC: 0 % (ref 0.0–0.2)

## 2021-08-03 LAB — TYPE AND SCREEN
ABO/RH(D): B POS
Antibody Screen: NEGATIVE

## 2021-08-03 NOTE — Progress Notes (Signed)
Surgery scheduled for exploratory laparotomy for large left ovarian dermoid cyst ?

## 2021-08-04 LAB — RPR: RPR Ser Ql: NONREACTIVE

## 2021-08-07 ENCOUNTER — Other Ambulatory Visit: Payer: Managed Care, Other (non HMO)

## 2021-08-08 NOTE — Anesthesia Preprocedure Evaluation (Addendum)
Anesthesia Evaluation  ?Patient identified by MRN, date of birth, ID band ?Patient awake ? ? ? ?Reviewed: ?Allergy & Precautions, NPO status , Patient's Chart, lab work & pertinent test results ? ?Airway ?Mallampati: III ? ?TM Distance: >3 FB ?Neck ROM: full ? ? ? Dental ?no notable dental hx. ? ?  ?Pulmonary ?neg pulmonary ROS,  ?  ?Pulmonary exam normal ? ? ? ? ? ? ? Cardiovascular ?negative cardio ROS ?Normal cardiovascular exam ? ? ?  ?Neuro/Psych ?negative neurological ROS ? negative psych ROS  ? GI/Hepatic ?negative GI ROS, Neg liver ROS,   ?Endo/Other  ?negative endocrine ROS ? Renal/GU ?  ? ?  ?Musculoskeletal ? ? Abdominal ?  ?Peds ? 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 ?Past Medical History: ?No date: Hypercholesteremia ?No date: Low vitamin D level ?No date: Ovarian cyst ? ?Past Surgical History: ?01/2020: LASIK ?No date: NO PAST SURGERIES ?No date: NO PAST SURGERIES ? ? ? ? Reproductive/Obstetrics ?negative OB ROS ? ?  ? ? ? ? ? ? ? ? ? ? ? ? ? ?  ?  ? ? ? ? ? ? ? ?Anesthesia Physical ?Anesthesia Plan ? ?ASA: 2 ? ?Anesthesia Plan: General ETT  ? ?Post-op Pain Management: Celebrex PO (pre-op)*, Tylenol PO (pre-op)* and Gabapentin PO (pre-op)*  ? ?Induction:  ? ?PONV Risk Score and Plan: 4 or greater and Ondansetron, Dexamethasone, Midazolam, Treatment may vary due to age or medical condition and Propofol infusion ? ?Airway Management Planned: Oral ETT ? ?Additional Equipment:  ? ?Intra-op Plan:  ? ?Post-operative Plan: Extubation in OR ? ?Informed Consent: I have reviewed the patients History and Physical, chart, labs and discussed the procedure including the risks, benefits and alternatives for the proposed anesthesia with the patient or authorized representative who has indicated his/her understanding and acceptance.  ? ? ? ?Dental advisory given ? ?Plan Discussed with: Anesthesiologist, CRNA and  Surgeon ? ?Anesthesia Plan Comments:   ? ? ? ? ?Anesthesia Quick Evaluation ? ?

## 2021-08-09 ENCOUNTER — Other Ambulatory Visit: Payer: Self-pay

## 2021-08-09 ENCOUNTER — Encounter: Payer: Self-pay | Admitting: Obstetrics and Gynecology

## 2021-08-09 ENCOUNTER — Inpatient Hospital Stay: Payer: Managed Care, Other (non HMO) | Admitting: Anesthesiology

## 2021-08-09 ENCOUNTER — Encounter
Admission: RE | Disposition: A | Discharge status: Home / Self Care | Payer: Self-pay | Source: Ambulatory Visit | Attending: Obstetrics and Gynecology

## 2021-08-09 ENCOUNTER — Inpatient Hospital Stay
Admission: RE | Admit: 2021-08-09 | Discharge: 2021-08-10 | DRG: 743 | Disposition: A | Discharge status: Home / Self Care | Payer: Managed Care, Other (non HMO) | Source: Ambulatory Visit | Attending: Obstetrics and Gynecology | Admitting: Obstetrics and Gynecology

## 2021-08-09 DIAGNOSIS — D271 Benign neoplasm of left ovary: Principal | ICD-10-CM | POA: Diagnosis present

## 2021-08-09 DIAGNOSIS — N83202 Unspecified ovarian cyst, left side: Secondary | ICD-10-CM | POA: Diagnosis present

## 2021-08-09 DIAGNOSIS — N979 Female infertility, unspecified: Secondary | ICD-10-CM | POA: Diagnosis present

## 2021-08-09 DIAGNOSIS — Z01818 Encounter for other preprocedural examination: Principal | ICD-10-CM

## 2021-08-09 DIAGNOSIS — Z9889 Other specified postprocedural states: Secondary | ICD-10-CM

## 2021-08-09 HISTORY — PX: LAPAROTOMY: SHX154

## 2021-08-09 LAB — POCT PREGNANCY, URINE: Preg Test, Ur: NEGATIVE

## 2021-08-09 SURGERY — LAPAROTOMY, EXPLORATORY
Anesthesia: General | Laterality: Left

## 2021-08-09 MED ORDER — SODIUM CHLORIDE (PF) 0.9 % IJ SOLN
INTRAMUSCULAR | Status: DC | PRN
  Administered 2021-08-09: 40 mL

## 2021-08-09 MED ORDER — ACETAMINOPHEN 500 MG PO TABS
ORAL_TABLET | ORAL | Status: AC
  Administered 2021-08-09: 1000 mg via ORAL
  Filled 2021-08-09: qty 2

## 2021-08-09 MED ORDER — STERILE WATER FOR IRRIGATION IR SOLN
Status: DC | PRN
Start: 2021-08-09 — End: 2021-08-09
  Administered 2021-08-09: 1000 mL

## 2021-08-09 MED ORDER — FENTANYL CITRATE (PF) 100 MCG/2ML IJ SOLN
INTRAMUSCULAR | Status: AC
  Administered 2021-08-09: 25 ug via INTRAVENOUS
  Filled 2021-08-09: qty 2

## 2021-08-09 MED ORDER — SODIUM CHLORIDE FLUSH 0.9 % IV SOLN
INTRAVENOUS | Status: AC
  Filled 2021-08-09: qty 20

## 2021-08-09 MED ORDER — ONDANSETRON HCL 4 MG/2ML IJ SOLN: 4.0000 mg | Freq: Four times a day (QID) | INTRAMUSCULAR | Status: DC | PRN

## 2021-08-09 MED ORDER — ROCURONIUM BROMIDE 100 MG/10ML IV SOLN
INTRAVENOUS | Status: DC | PRN
  Administered 2021-08-09: 10 mg via INTRAVENOUS
  Administered 2021-08-09: 50 mg via INTRAVENOUS

## 2021-08-09 MED ORDER — DROPERIDOL 2.5 MG/ML IJ SOLN: 0.6250 mg | Freq: Once | INTRAMUSCULAR | Status: DC | PRN

## 2021-08-09 MED ORDER — ACETAMINOPHEN 10 MG/ML IV SOLN: 1000.0000 mg | Freq: Once | INTRAVENOUS | Status: DC | PRN

## 2021-08-09 MED ORDER — CHLORHEXIDINE GLUCONATE 0.12 % MT SOLN
OROMUCOSAL | Status: AC
  Administered 2021-08-09: 15 mL via OROMUCOSAL
  Filled 2021-08-09: qty 15

## 2021-08-09 MED ORDER — KETAMINE HCL 10 MG/ML IJ SOLN
INTRAMUSCULAR | Status: DC | PRN
  Administered 2021-08-09: 30 mg via INTRAVENOUS

## 2021-08-09 MED ORDER — ROCURONIUM BROMIDE 10 MG/ML (PF) SYRINGE
PREFILLED_SYRINGE | INTRAVENOUS | Status: AC
  Filled 2021-08-09: qty 10

## 2021-08-09 MED ORDER — EPHEDRINE SULFATE (PRESSORS) 50 MG/ML IJ SOLN
INTRAMUSCULAR | Status: DC | PRN
Start: 2021-08-09 — End: 2021-08-09
  Administered 2021-08-09: 5 mg via INTRAVENOUS

## 2021-08-09 MED ORDER — POVIDONE-IODINE 10 % EX SWAB: 2.0000 "application " | Freq: Once | CUTANEOUS | Status: DC

## 2021-08-09 MED ORDER — FAMOTIDINE 20 MG PO TABS: 20.0000 mg | ORAL_TABLET | Freq: Once | ORAL | Status: AC

## 2021-08-09 MED ORDER — PROPOFOL 500 MG/50ML IV EMUL
INTRAVENOUS | Status: DC | PRN
  Administered 2021-08-09: 15 ug/kg/min via INTRAVENOUS

## 2021-08-09 MED ORDER — ONDANSETRON HCL 4 MG/2ML IJ SOLN
INTRAMUSCULAR | Status: AC
Start: 2021-08-09 — End: ?
  Filled 2021-08-09: qty 2

## 2021-08-09 MED ORDER — OXYCODONE HCL 5 MG PO TABS
5.0000 mg | ORAL_TABLET | Freq: Once | ORAL | Status: AC | PRN
  Administered 2021-08-09: 5 mg via ORAL

## 2021-08-09 MED ORDER — LACTATED RINGERS IV SOLN: INTRAVENOUS | Status: DC

## 2021-08-09 MED ORDER — ACETAMINOPHEN 500 MG PO TABS: 1000.0000 mg | ORAL_TABLET | Freq: Once | ORAL | Status: DC

## 2021-08-09 MED ORDER — OXYCODONE HCL 5 MG PO TABS
ORAL_TABLET | ORAL | Status: AC
  Filled 2021-08-09: qty 1

## 2021-08-09 MED ORDER — GABAPENTIN 300 MG PO CAPS
ORAL_CAPSULE | ORAL | Status: AC
  Administered 2021-08-09: 300 mg via ORAL
  Filled 2021-08-09: qty 1

## 2021-08-09 MED ORDER — 0.9 % SODIUM CHLORIDE (POUR BTL) OPTIME
TOPICAL | Status: DC | PRN
  Administered 2021-08-09: 1500 mL

## 2021-08-09 MED ORDER — DEXAMETHASONE SODIUM PHOSPHATE 10 MG/ML IJ SOLN
INTRAMUSCULAR | Status: AC
  Filled 2021-08-09: qty 1

## 2021-08-09 MED ORDER — FENTANYL CITRATE (PF) 100 MCG/2ML IJ SOLN
INTRAMUSCULAR | Status: AC
  Filled 2021-08-09: qty 2

## 2021-08-09 MED ORDER — MIDAZOLAM HCL 2 MG/2ML IJ SOLN
INTRAMUSCULAR | Status: AC
  Filled 2021-08-09: qty 2

## 2021-08-09 MED ORDER — LACTATED RINGERS IV SOLN
INTRAVENOUS | Status: DC
Start: 2021-08-09 — End: 2021-08-09

## 2021-08-09 MED ORDER — FENTANYL CITRATE (PF) 100 MCG/2ML IJ SOLN
INTRAMUSCULAR | Status: AC
Start: 2021-08-09 — End: ?
  Filled 2021-08-09: qty 2

## 2021-08-09 MED ORDER — PROPOFOL 10 MG/ML IV BOLUS
INTRAVENOUS | Status: DC | PRN
  Administered 2021-08-09: 50 mg via INTRAVENOUS
  Administered 2021-08-09: 100 mg via INTRAVENOUS
  Administered 2021-08-09: 150 mg via INTRAVENOUS

## 2021-08-09 MED ORDER — OXYCODONE HCL 5 MG/5ML PO SOLN: 5.0000 mg | Freq: Once | ORAL | Status: AC | PRN

## 2021-08-09 MED ORDER — ONDANSETRON HCL 4 MG/2ML IJ SOLN
INTRAMUSCULAR | Status: DC | PRN
Start: 2021-08-09 — End: 2021-08-09
  Administered 2021-08-09: 4 mg via INTRAVENOUS

## 2021-08-09 MED ORDER — GABAPENTIN 300 MG PO CAPS
300.0000 mg | ORAL_CAPSULE | Freq: Every day | ORAL | Status: DC
  Administered 2021-08-09: 300 mg via ORAL
  Filled 2021-08-09: qty 1

## 2021-08-09 MED ORDER — MORPHINE SULFATE (PF) 2 MG/ML IV SOLN: 1.0000 mg | INTRAVENOUS | Status: DC | PRN

## 2021-08-09 MED ORDER — ONDANSETRON HCL 4 MG PO TABS: 4.0000 mg | ORAL_TABLET | Freq: Four times a day (QID) | ORAL | Status: DC | PRN

## 2021-08-09 MED ORDER — PROMETHAZINE HCL 25 MG/ML IJ SOLN: 6.2500 mg | INTRAMUSCULAR | Status: DC | PRN

## 2021-08-09 MED ORDER — BUPIVACAINE HCL (PF) 0.5 % IJ SOLN
INTRAMUSCULAR | Status: AC
  Filled 2021-08-09: qty 30

## 2021-08-09 MED ORDER — GABAPENTIN 300 MG PO CAPS: 300.0000 mg | ORAL_CAPSULE | ORAL | Status: AC

## 2021-08-09 MED ORDER — PROPOFOL 10 MG/ML IV BOLUS
INTRAVENOUS | Status: AC
  Filled 2021-08-09: qty 40

## 2021-08-09 MED ORDER — CHLORHEXIDINE GLUCONATE 0.12 % MT SOLN: 15.0000 mL | Freq: Once | OROMUCOSAL | Status: AC

## 2021-08-09 MED ORDER — GABAPENTIN 600 MG PO TABS
300.0000 mg | ORAL_TABLET | Freq: Once | ORAL | Status: DC
  Filled 2021-08-09: qty 0.5

## 2021-08-09 MED ORDER — CELECOXIB 200 MG PO CAPS
ORAL_CAPSULE | ORAL | Status: AC
  Administered 2021-08-09: 200 mg via ORAL
  Filled 2021-08-09: qty 1

## 2021-08-09 MED ORDER — ORAL CARE MOUTH RINSE: 15.0000 mL | Freq: Once | OROMUCOSAL | Status: AC

## 2021-08-09 MED ORDER — SODIUM CHLORIDE (PF) 0.9 % IJ SOLN
INTRAMUSCULAR | Status: AC
  Filled 2021-08-09: qty 50

## 2021-08-09 MED ORDER — PHENYLEPHRINE HCL (PRESSORS) 10 MG/ML IV SOLN
INTRAVENOUS | Status: DC | PRN
  Administered 2021-08-09 (×3): 80 ug via INTRAVENOUS

## 2021-08-09 MED ORDER — LACTATED RINGERS IV SOLN: INTRAVENOUS | Status: DC | PRN

## 2021-08-09 MED ORDER — LIDOCAINE HCL (CARDIAC) PF 100 MG/5ML IV SOSY
PREFILLED_SYRINGE | INTRAVENOUS | Status: DC | PRN
Start: 2021-08-09 — End: 2021-08-09
  Administered 2021-08-09: 100 mg via INTRAVENOUS

## 2021-08-09 MED ORDER — DEXAMETHASONE SODIUM PHOSPHATE 10 MG/ML IJ SOLN
INTRAMUSCULAR | Status: DC | PRN
  Administered 2021-08-09: 10 mg via INTRAVENOUS

## 2021-08-09 MED ORDER — FENTANYL CITRATE (PF) 100 MCG/2ML IJ SOLN
INTRAMUSCULAR | Status: DC | PRN
  Administered 2021-08-09 (×3): 50 ug via INTRAVENOUS

## 2021-08-09 MED ORDER — KETAMINE HCL 50 MG/5ML IJ SOSY
PREFILLED_SYRINGE | INTRAMUSCULAR | Status: AC
  Filled 2021-08-09: qty 5

## 2021-08-09 MED ORDER — ACETAMINOPHEN 500 MG PO TABS
1000.0000 mg | ORAL_TABLET | Freq: Four times a day (QID) | ORAL | Status: DC
  Administered 2021-08-09 – 2021-08-10 (×4): 1000 mg via ORAL
  Filled 2021-08-09 (×4): qty 2

## 2021-08-09 MED ORDER — CELECOXIB 200 MG PO CAPS
200.0000 mg | ORAL_CAPSULE | Freq: Once | ORAL | Status: AC
Start: 2021-08-09 — End: 2021-08-09

## 2021-08-09 MED ORDER — BUPIVACAINE LIPOSOME 1.3 % IJ SUSP
INTRAMUSCULAR | Status: AC
  Filled 2021-08-09: qty 20

## 2021-08-09 MED ORDER — SUGAMMADEX SODIUM 200 MG/2ML IV SOLN
INTRAVENOUS | Status: DC | PRN
Start: 2021-08-09 — End: 2021-08-09
  Administered 2021-08-09: 200 mg via INTRAVENOUS

## 2021-08-09 MED ORDER — LIDOCAINE HCL (PF) 2 % IJ SOLN
INTRAMUSCULAR | Status: AC
  Filled 2021-08-09: qty 5

## 2021-08-09 MED ORDER — OXYCODONE HCL 5 MG PO TABS
5.0000 mg | ORAL_TABLET | ORAL | Status: DC | PRN
  Administered 2021-08-09 – 2021-08-10 (×2): 5 mg via ORAL
  Filled 2021-08-09 (×2): qty 1

## 2021-08-09 MED ORDER — MIDAZOLAM HCL 2 MG/2ML IJ SOLN
INTRAMUSCULAR | Status: DC | PRN
Start: 2021-08-09 — End: 2021-08-09
  Administered 2021-08-09: 2 mg via INTRAVENOUS

## 2021-08-09 MED ORDER — SIMETHICONE 80 MG PO CHEW: 80.0000 mg | CHEWABLE_TABLET | Freq: Four times a day (QID) | ORAL | Status: DC | PRN

## 2021-08-09 MED ORDER — FENTANYL CITRATE (PF) 100 MCG/2ML IJ SOLN
25.0000 ug | INTRAMUSCULAR | Status: DC | PRN
  Administered 2021-08-09 (×3): 25 ug via INTRAVENOUS

## 2021-08-09 MED ORDER — ACETAMINOPHEN 500 MG PO TABS: 1000.0000 mg | ORAL_TABLET | ORAL | Status: AC

## 2021-08-09 MED ORDER — FAMOTIDINE 20 MG PO TABS
ORAL_TABLET | ORAL | Status: AC
  Administered 2021-08-09: 20 mg via ORAL
  Filled 2021-08-09: qty 1

## 2021-08-09 MED ORDER — DOCUSATE SODIUM 100 MG PO CAPS
100.0000 mg | ORAL_CAPSULE | Freq: Two times a day (BID) | ORAL | Status: DC
  Administered 2021-08-09 – 2021-08-10 (×2): 100 mg via ORAL
  Filled 2021-08-09 (×2): qty 1

## 2021-08-09 SURGICAL SUPPLY — 39 items
CNTNR SPEC 2.5X3XGRAD LEK (MISCELLANEOUS) ×1
CONT SPEC 4OZ STER OR WHT (MISCELLANEOUS) ×1
CONTAINER SPEC 2.5X3XGRAD LEK (MISCELLANEOUS) IMPLANT
DRAPE LAPAROTOMY TRNSV 106X77 (MISCELLANEOUS) ×2 IMPLANT
DRSG OPSITE POSTOP 4X10 (GAUZE/BANDAGES/DRESSINGS) ×1 IMPLANT
DRSG TELFA 3X8 NADH (GAUZE/BANDAGES/DRESSINGS) ×2 IMPLANT
ELECT REM PT RETURN 9FT ADLT (ELECTROSURGICAL) ×2
ELECTRODE REM PT RTRN 9FT ADLT (ELECTROSURGICAL) ×1 IMPLANT
GAUZE 4X4 16PLY ~~LOC~~+RFID DBL (SPONGE) ×3 IMPLANT
GLOVE SURG SYN 8.0 (GLOVE) ×2 IMPLANT
GLOVE SURG SYN 8.0 PF PI (GLOVE) ×1 IMPLANT
GLOVE SURG UNDER LTX SZ8 (GLOVE) ×2 IMPLANT
GOWN STRL REUS W/ TWL LRG LVL3 (GOWN DISPOSABLE) ×1 IMPLANT
GOWN STRL REUS W/ TWL XL LVL3 (GOWN DISPOSABLE) ×1 IMPLANT
GOWN STRL REUS W/TWL LRG LVL3 (GOWN DISPOSABLE) ×1
GOWN STRL REUS W/TWL XL LVL3 (GOWN DISPOSABLE) ×1
IV NS 1000ML (IV SOLUTION) ×1
IV NS 1000ML BAXH (IV SOLUTION) ×1 IMPLANT
KIT TURNOVER CYSTO (KITS) ×2 IMPLANT
LABEL OR SOLS (LABEL) ×2 IMPLANT
MANIFOLD NEPTUNE II (INSTRUMENTS) ×2 IMPLANT
NDL HYPO 21X1.5 SAFETY (NEEDLE) ×1 IMPLANT
NEEDLE HYPO 21X1.5 SAFETY (NEEDLE) ×2 IMPLANT
PACK BASIN MAJOR ARMC (MISCELLANEOUS) ×2 IMPLANT
PAD DRESSING TELFA 3X8 NADH (GAUZE/BANDAGES/DRESSINGS) ×1 IMPLANT
SPONGE KITTNER 5P (MISCELLANEOUS) ×2 IMPLANT
SPONGE T-LAP 18X18 ~~LOC~~+RFID (SPONGE) ×4 IMPLANT
STAPLER INSORB 30 2030 C-SECTI (MISCELLANEOUS) ×1 IMPLANT
SUT CHROMIC 2 0 CT 1 (SUTURE) ×1 IMPLANT
SUT VIC AB 0 CT1 27 (SUTURE) ×2
SUT VIC AB 0 CT1 27XCR 8 STRN (SUTURE) ×2 IMPLANT
SUT VIC AB 3-0 SH 27 (SUTURE) ×1
SUT VIC AB 3-0 SH 27X BRD (SUTURE) IMPLANT
SYR 10ML LL (SYRINGE) ×2 IMPLANT
SYR 30ML LL (SYRINGE) ×3 IMPLANT
SYR BULB IRRIG 60ML STRL (SYRINGE) ×2 IMPLANT
TRAY FOLEY MTR SLVR 16FR STAT (SET/KITS/TRAYS/PACK) ×2 IMPLANT
WATER STERILE IRR 1000ML POUR (IV SOLUTION) ×2 IMPLANT
WATER STERILE IRR 500ML POUR (IV SOLUTION) ×2 IMPLANT

## 2021-08-09 NOTE — Op Note (Signed)
NAME: Judy Gomez, Judy H. ?MEDICAL RECORD NO: [id number] ?ACCOUNT NO: [id number] ?DATE OF BIRTH: 1985/05/08 ?FACILITY: [facility] ?LOCATION: [facility] ?PHYSICIAN: Boykin Nearing, MD ? ?Operative Report  ? ?DATE OF PROCEDURE: 08/09/2021 ? ?PREOPERATIVE DIAGNOSIS:  Symptomatic left ovarian cyst. ? 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 ? ?PROCEDURE:  Laparotomy and left ovarian cystectomy. ? ?SURGEON:  Boykin Nearing, MD ? ?FIRST ASSISTANT:  Benjaman Kindler, MD ? ?ANESTHESIA:  General endotracheal anesthesia. ? ?INDICATIONS:  A 36 year old female with a long history of enlarging left ovarian cyst, contents on ultrasound are consistent with a dermoid cyst. ? ?DESCRIPTION OF PROCEDURE:  After adequate general endotracheal anesthesia, the patient was placed in dorsal supine position.  The patient's abdomen, perineum and vagina were prepped and draped in normal sterile fashion.  Foley catheter had been  ?previously placed.  Timeout was performed.  A Pfannenstiel incision was made 2 fingerbreadths above the symphysis pubis.  Sharp dissection was used to identify the fascia.  Fascia was opened in the midline and opened in transverse fashion.  Superior  ?aspect of the fascia was grasped with Kocher clamps and recti muscles dissected free.  Inferior aspect of fascia was grasped with Kocher clamps.  Pyramidalis muscle was dissected free.  Entry into the peritoneal cavity was accomplished sharply.  A large  ?left ovarian cyst was identified and elevated through the incision and draped with moist laparotomy sponges.  A #11 blade scalpel was used to score the cyst wall.  Meticulous dissection ensued with enucleating the ovarian cyst.  During the procedure,  ?there were spontaneous draining of fatty material through the cyst itself.  This was suctioned and draped off as to not allow for spillage into the abdominal cavity.  The cyst ultimately was then removed and placed off  the operative field.  The ovarian  ?capsule was then irrigated and bovied for hemostasis and the capsule was then closed with a pursestring 3-0 Vicryl suture with reapproximation of the attenuated capsule.  Good hemostasis was noted.  The patient's abdomen was irrigated and suctioned.  The ? right ovary was visualized and appeared normal.  Uterus appeared normal.  Fallopian tubes appeared normal.  Fascia was then closed with 0 Vicryl suture in a running nonlocking fashion, two separate sutures were used. Fascial edges were injected with a  ?solution of 60 mL of 0.5% Marcaine and 20 mL normal saline.  50 mL of solution used.  Subcutaneous tissues were irrigated and bovied and given the depth of the subcutaneous tissues of 3 cm, the dead space was closed with a 2-0 chromic suture in a running ? nonlocking. Skin was reapproximated with Insorb absorbable staples and the skin was then injected with additional 20 mL of Marcaine solution.  There were no complications. ? ?ESTIMATED BLOOD LOSS:  25 mL ? ?INTRAOPERATIVE FLUIDS:  1000 mL ? ?URINE OUTPUT:  400 mL. ? ?She was taken to recovery room in good condition. ? ? ?PUS ?D: 08/09/2021 10:00:08 am T: 08/09/2021 2:52:00 pm  ?JOB: [id number]/ [id number]  ?

## 2021-08-09 NOTE — Progress Notes (Signed)
Day of Surgery Procedure(s) (LRB): ?EXPLORATORY LAPAROTOMY LEFT OVARIAN CYSTECTOMY (Left) ? ?Subjective: ?Patient reports no problems voiding.   ? ?Objective: ?I have reviewed patient's vital signs. ? ?GI: soft, non-tender; bowel sounds normal; no masses,  no organomegaly ?Abd soft NT  ?Assessment: ?s/p Procedure(s): ?EXPLORATORY LAPAROTOMY LEFT OVARIAN CYSTECTOMY (Left): stable ? ?Plan: ?Advance diet ?Plan on d/c tomorrow  ? LOS: 0 days  ? ? ?Judy Gomez ?08/09/2021, 5:31 PM ? ? ? ? ?

## 2021-08-09 NOTE — Brief Op Note (Signed)
08/09/2021 ? ?9:14 AM ? ?PATIENT:  Judy Gomez  36 y.o. female ? ?PRE-OPERATIVE DIAGNOSIS:  12X11 cm left ovarian teratoma ? ?POST-OPERATIVE DIAGNOSIS:  12X11 cm left ovarian teratoma ? ?PROCEDURE:  Procedure(s): ?EXPLORATORY LAPAROTOMY LEFT OVARIAN CYSTECTOMY (Left) ? ?SURGEON:  Surgeon(s) and Role: ?   * Dorotea Hand, Gwen Her, MD - Primary ?   PHYSICIAN ASSISTANT:  Leafy Ro ,MD ? ?ASSISTANTS: none  ? ?ANESTHESIA:   general ? ?EBL:  25 mL IOF 1000 uo 400 cc ? ?BLOOD ADMINISTERED:none ? ?DRAINS: Urinary Catheter (Foley)  ? ?LOCAL MEDICATIONS USED:  MARCAINE    ? ?SPECIMEN:  Source of Specimen:  left ovarian dermoid cyst  ? ?DISPOSITION OF SPECIMEN:  PATHOLOGY ? ?COUNTS:  YES ? ?TOURNIQUET:  * No tourniquets in log * ? ?DICTATION: .Other Dictation: Dictation Number verbal ? ?PLAN OF CARE: Admit to inpatient  ? ?PATIENT DISPOSITION:  PACU - hemodynamically stable. ?  ?Delay start of Pharmacological VTE agent (>24hrs) due to surgical blood loss or risk of bleeding: not applicable ? ?

## 2021-08-09 NOTE — Progress Notes (Signed)
Here for exlap and left ovarian cystectomy , possible oophorectomy . Labs reviewed . All questions answered . Proceed  ?

## 2021-08-09 NOTE — Anesthesia Procedure Notes (Signed)
Procedure Name: Intubation ?Date/Time: 08/09/2021 7:37 AM ?Performed by: Terrence Dupont, RN ?Pre-anesthesia Checklist: Patient identified, Patient being monitored, Timeout performed, Emergency Drugs available and Suction available ?Patient Re-evaluated:Patient Re-evaluated prior to induction ?Oxygen Delivery Method: Circle system utilized ?Preoxygenation: Pre-oxygenation with 100% oxygen ?Induction Type: IV induction ?Ventilation: Mask ventilation without difficulty ?Laryngoscope Size: Mac and 3 ?Grade View: Grade II ?Tube type: Oral ?Tube size: 7.0 mm ?Number of attempts: 1 ?Airway Equipment and Method: Stylet ?Placement Confirmation: ETT inserted through vocal cords under direct vision, positive ETCO2 and breath sounds checked- equal and bilateral ?Secured at: 21 cm ?Tube secured with: Tape ?Dental Injury: Teeth and Oropharynx as per pre-operative assessment  ? ? ? ? ?

## 2021-08-09 NOTE — Transfer of Care (Signed)
Immediate Anesthesia Transfer of Care Note ? ?Patient: Judy Gomez ? ?Procedure(s) Performed: EXPLORATORY LAPAROTOMY LEFT OVARIAN CYSTECTOMY (Left) ? ?Patient Location: PACU ? ?Anesthesia Type:General ? ?Level of Consciousness: drowsy ? ?Airway & Oxygen Therapy: Patient Spontanous Breathing and Patient connected to face mask oxygen ? ?Post-op Assessment: Report given to RN and Post -op Vital signs reviewed and stable ? ?Post vital signs: Reviewed and stable ? ?Last Vitals:  ?Vitals Value Taken Time  ?BP 97/58 08/09/21 0926  ?Temp    ?Pulse 66 08/09/21 0926  ?Resp 21 08/09/21 0926  ?SpO2 100 % 08/09/21 0926  ?Vitals shown include unvalidated device data. ? ?Last Pain:  ?Vitals:  ? 08/09/21 0622  ?TempSrc: Oral  ?PainSc: 0-No pain  ?   ? ?  ? ?Complications: No notable events documented. ?

## 2021-08-10 LAB — SURGICAL PATHOLOGY

## 2021-08-10 MED ORDER — GABAPENTIN 300 MG PO CAPS: 300.0000 mg | ORAL_CAPSULE | Freq: Every day | ORAL | 0 refills | Status: DC

## 2021-08-10 MED ORDER — OXYCODONE-ACETAMINOPHEN 5-325 MG PO TABS: 1.0000 | ORAL_TABLET | ORAL | 0 refills | Status: DC | PRN

## 2021-08-10 MED ORDER — IBUPROFEN 800 MG PO TABS: 800.0000 mg | ORAL_TABLET | Freq: Three times a day (TID) | ORAL | 0 refills | Status: DC | PRN

## 2021-08-10 MED ORDER — ONDANSETRON HCL 4 MG PO TABS: 4.0000 mg | ORAL_TABLET | Freq: Three times a day (TID) | ORAL | 1 refills | Status: AC | PRN

## 2021-08-10 NOTE — Progress Notes (Signed)
Pt discharged home.  Discharge instructions, prescriptions and follow up appointment given to and reviewed with pt.  Pt verbalized understanding.  Escorted by auxillary. 

## 2021-08-10 NOTE — Progress Notes (Signed)
1 Day Post-Op Procedure(s) (LRB): ?EXPLORATORY LAPAROTOMY LEFT OVARIAN CYSTECTOMY (Left) ? ?Subjective: ?Patient reports no c/o .   ? ?Objective: ?I have reviewed patient's vital signs and medications. ? ?General: alert and cooperative ?Resp: clear to auscultation bilaterally ?Cardio: regular rate and rhythm, S1, S2 normal, no murmur, click, rub or gallop ?GI: soft, non-tender; bowel sounds normal; no masses,  no organomegaly ? ?Assessment: ?s/p Procedure(s): ?EXPLORATORY LAPAROTOMY LEFT OVARIAN CYSTECTOMY (Left): stable ? ?Plan: ?Discharge home ? LOS: 1 day  ? ? ?Gwen Her Angelica Frandsen ?08/10/2021, 7:48 AM ? ? ? ? ?

## 2021-08-10 NOTE — Anesthesia Postprocedure Evaluation (Signed)
Anesthesia Post Note ? ?Patient: Annalynn Centanni Urista ? ?Procedure(s) Performed: EXPLORATORY LAPAROTOMY LEFT OVARIAN CYSTECTOMY (Left) ? ?Patient location during evaluation: PACU ?Anesthesia Type: General ?Level of consciousness: awake and alert ?Pain management: pain level controlled ?Vital Signs Assessment: post-procedure vital signs reviewed and stable ?Respiratory status: spontaneous breathing, nonlabored ventilation and respiratory function stable ?Cardiovascular status: blood pressure returned to baseline and stable ?Postop Assessment: no apparent nausea or vomiting ?Anesthetic complications: no ? ? ?No notable events documented. ? ? ?Last Vitals:  ?Vitals:  ? 08/10/21 0322 08/10/21 0826  ?BP: (!) 90/57 (!) 95/59  ?Pulse: 60 85  ?Resp: 18 16  ?Temp: 36.9 ?C   ?SpO2: 98% 97%  ?  ?Last Pain:  ?Vitals:  ? 08/10/21 1050  ?TempSrc:   ?PainSc: 4   ? ? ?  ?  ?  ?  ?  ?  ? ?[place] Ouch ? ? ? ? ?

## 2021-08-10 NOTE — Discharge Summary (Signed)
Physician Discharge Summary  ?Patient ID: ?Judy Gomez ?MRN: [id number] ?DOB/AGE: 35-03-1986 35 y.o. ? ?Admit date: 08/09/2021 ?Discharge date: 08/12/2021 ? ?Admission Diagnoses:left dermoid cyst  ? ?Discharge Diagnoses:  ?Principal Problem: ?  Postoperative state ?Active Problems: ?  Post-operative state ? ? ?Discharged Condition: good ? ?Hospital Course: underwent an uncomplicated laparotomy and left ovarian cystectomy  ? ?Consults: None ? ?Significant Diagnostic Studies: none ? ?Treatments: surgery: asabove ? ?Discharge Exam: ?Blood pressure (!) 95/59, pulse 85, temperature 98.5 ?F (36.9 ?C), temperature source Oral, resp. rate 16, height '5\' 4"'$  (1.626 m), weight 78 kg, SpO2 97 %, unknown if currently breastfeeding. ?General appearance: alert and cooperative ?Resp: clear to auscultation bilaterally ?Cardio: regular rate and rhythm, S1, S2 normal, no murmur, click, rub or gallop ?GI: soft, non-tender; bowel sounds normal; no masses,  no organomegaly ? ?Disposition: d/c home Discharge disposition: 01-Home or Self Care ? ? ? ? ? ? ? ?Allergies as of 08/10/2021   ?No Known Allergies ?  ? ?  ?Medication List  ?  ? ?TAKE these medications   ? ?gabapentin 300 MG capsule ?Commonly known as: Neurontin ?Take 1 capsule (300 mg total) by mouth at bedtime for 10 days. ?  ?ibuprofen 800 MG tablet ?Commonly known as: ADVIL ?Take 1 tablet (800 mg total) by mouth every 8 (eight) hours as needed. ?  ?ondansetron 4 MG tablet ?Commonly known as: Zofran ?Take 1 tablet (4 mg total) by mouth every 8 (eight) hours as needed for nausea or vomiting. ?  ?oxyCODONE-acetaminophen 5-325 MG tablet ?Commonly known as: Percocet ?Take 1 tablet by mouth every 4 (four) hours as needed for severe pain. ?  ?rosuvastatin 5 MG tablet ?Commonly known as: CRESTOR ?Take 5 mg by mouth daily. ?  ?Vitamin D (Ergocalciferol) 1.25 MG (50000 UNIT) Caps capsule ?Commonly known as: DRISDOL ?Take 50,000 Units by mouth once a week. ?  ? ?  ? ? Follow-up Information    ? ? Lilliam Chamblee, Gwen Her, MD Follow up on 08/28/2021.   ?Specialty: Obstetrics and Gynecology ?Why: post op  > may 23 @ 3:00 pm ?Contact information: ?[address] ?[facility] ?[place] [address] ?[phone] ? ? ?  ?  ? ?  ?  ? ?  ? ? ?Signed: ?Minda Meo ?08/12/2021, 12:05 PM ? ? ?

## 2021-08-12 NOTE — Discharge Summary (Signed)
Patient ID: ?Judy Gomez ?MRN: [id number] ?DOB/AGE: [birth year] 36 y.o. ?  ?Admit date: 08/09/2021 ?Discharge date: 08/10/2021 ?  ?Admission Diagnoses:left dermoid cyst  ?  ?Discharge Diagnoses:  ?Principal Problem: ?  Postoperative state ?Active Problems: ?  Post-operative state ?  ?  ?Discharged Condition: good ?  ?Hospital Course: underwent an uncomplicated laparotomy and left ovarian cystectomy  ?  ?Consults: None ?  ?Significant Diagnostic Studies: none ?  ?Treatments: surgery: asabove ?  ?Discharge Exam: ?Blood pressure (!) 90/57, pulse 60, temperature 98.5 ?F (36.9 ?C), temperature source Oral, resp. rate 18, height '5\' 4"'$  (1.626 m), weight 78 kg, SpO2 98 %, unknown if currently breastfeeding. ?General appearance: alert and cooperative ?Resp: clear to auscultation bilaterally ?Cardio: regular rate and rhythm, S1, S2 normal, no murmur, click, rub or gallop ?GI: soft, non-tender; bowel sounds normal; no masses,  no organomegaly ?  ?Disposition: d/c home  ?  ?  ?Allergies as of 08/10/2021   ?No Known Allergies ?   ?  ?  ?  ?  ?  ?  ?  ?Signed: ?Gwen Her Lena Fieldhouse ?08/10/2021, 7:49 AM ?  ?  ?

## 2021-09-10 ENCOUNTER — Telehealth: Payer: Self-pay

## 2021-09-10 NOTE — Telephone Encounter (Signed)
Pt left a message on triage she had a cyst removed on 5/4 and now has a positive pregnancy test, Has questions about her incision. I left a message to advise pt to call [facility]. Dr Ouida Sills did her surgery.

## 2021-09-29 ENCOUNTER — Emergency Department
Admission: EM | Admit: 2021-09-29 | Discharge: 2021-09-29 | Disposition: A | Discharge status: Home / Self Care | Payer: Managed Care, Other (non HMO) | Attending: Student in an Organized Health Care Education/Training Program | Admitting: Student in an Organized Health Care Education/Training Program

## 2021-09-29 ENCOUNTER — Other Ambulatory Visit: Payer: Self-pay

## 2021-09-29 ENCOUNTER — Emergency Department: Payer: Managed Care, Other (non HMO)

## 2021-09-29 ENCOUNTER — Encounter: Payer: Self-pay | Admitting: Emergency Medicine

## 2021-09-29 DIAGNOSIS — O209 Hemorrhage in early pregnancy, unspecified: Secondary | ICD-10-CM | POA: Insufficient documentation

## 2021-09-29 DIAGNOSIS — Z3A01 Less than 8 weeks gestation of pregnancy: Secondary | ICD-10-CM | POA: Insufficient documentation

## 2021-09-29 DIAGNOSIS — O4690 Antepartum hemorrhage, unspecified, unspecified trimester: Secondary | ICD-10-CM

## 2021-09-29 LAB — CBC WITH DIFFERENTIAL/PLATELET
Abs Immature Granulocytes: 0.04 10*3/uL (ref 0.00–0.07)
Basophils Absolute: 0.1 10*3/uL (ref 0.0–0.1)
Basophils Relative: 1 %
Eosinophils Absolute: 0.3 10*3/uL (ref 0.0–0.5)
Eosinophils Relative: 3 %
HCT: 38.3 % (ref 36.0–46.0)
Hemoglobin: 12.4 g/dL (ref 12.0–15.0)
Immature Granulocytes: 0 %
Lymphocytes Relative: 23 %
Lymphs Abs: 2.4 10*3/uL (ref 0.7–4.0)
MCH: 26.3 pg (ref 26.0–34.0)
MCHC: 32.4 g/dL (ref 30.0–36.0)
MCV: 81.1 fL (ref 80.0–100.0)
Monocytes Absolute: 0.8 10*3/uL (ref 0.1–1.0)
Monocytes Relative: 7 %
Neutro Abs: 7 10*3/uL (ref 1.7–7.7)
Neutrophils Relative %: 66 %
Platelets: 339 10*3/uL (ref 150–400)
RBC: 4.72 MIL/uL (ref 3.87–5.11)
RDW: 14.3 % (ref 11.5–15.5)
WBC: 10.6 10*3/uL — ABNORMAL HIGH (ref 4.0–10.5)
nRBC: 0 % (ref 0.0–0.2)

## 2021-09-29 LAB — BASIC METABOLIC PANEL
Anion gap: 7 (ref 5–15)
BUN: 11 mg/dL (ref 6–20)
CO2: 23 mmol/L (ref 22–32)
Calcium: 9.3 mg/dL (ref 8.9–10.3)
Chloride: 105 mmol/L (ref 98–111)
Creatinine, Ser: 0.46 mg/dL (ref 0.44–1.00)
GFR, Estimated: >60 mL/min (ref >60)
Glucose, Bld: 123 mg/dL — ABNORMAL HIGH (ref 70–99)
Potassium: 4 mmol/L (ref 3.5–5.1)
Sodium: 135 mmol/L (ref 135–145)

## 2021-09-29 LAB — HCG, QUANTITATIVE, PREGNANCY: hCG, Beta Chain, Quant, S: 55633 m[IU]/mL — ABNORMAL HIGH (ref <5)

## 2021-09-29 NOTE — ED Provider Notes (Signed)
[facility] Provider Note    Event Date/Time   First MD Initiated Contact with Patient 09/29/21 1133     (approximate)   History   Vaginal Bleeding   HPI  Judy Gomez is a 36 y.o. female  [phone]  Presents to the ER for evaluation of vaginal bleeding in early pregnancy.  Thinks that she is roughly [redacted] weeks pregnant.  Just had cystectomy left ovary beginning of May.  Has had follow-up with OB/GYN for this but due to pregnancy but has not had IUP confirmed by review of her outpatient ultrasounds.  She is having some cramping.  Did pass some clot-like tissue overnight.  Symptoms been going on less than 24 hours starting around 10 PM last night.        Physical Exam   Triage Vital Signs: ED Triage Vitals  Enc Vitals Group     BP 09/29/21 1130 (!) 125/52     Pulse Rate 09/29/21 1130 86     Resp 09/29/21 1130 20     Temp 09/29/21 1130 98.2 F (36.8 C)     Temp Source 09/29/21 1130 Oral     SpO2 09/29/21 1130 96 %     Weight 09/29/21 1128 174 lb (78.9 kg)     Height 09/29/21 1128 5\' 4"  (1.626 m)     Head Circumference --      Peak Flow --      Pain Score 09/29/21 1128 4     Pain Loc --      Pain Edu? --      Excl. in GC? --     Most recent vital signs: Vitals:   09/29/21 1130  BP: (!) 125/52  Pulse: 86  Resp: 20  Temp: 98.2 F (36.8 C)  SpO2: 96%     Constitutional: Alert  Eyes: Conjunctivae are normal.  Head: Atraumatic. Nose: No congestion/rhinnorhea. Mouth/Throat: Mucous membranes are moist.   Neck: Painless ROM.  Cardiovascular:   Good peripheral circulation. Respiratory: Normal respiratory effort.  No retractions.  Gastrointestinal: Soft and nontender.  Musculoskeletal:  no deformity Neurologic:  MAE spontaneously. No gross focal neurologic deficits are appreciated.  Skin:  Skin is warm, dry and intact. No rash noted. Psychiatric: Mood and affect are normal. Speech and behavior are normal.    ED Results /  Procedures / Treatments   Labs (all labs ordered are listed, but only abnormal results are displayed) Labs Reviewed  CBC WITH DIFFERENTIAL/PLATELET - Abnormal; Notable for the following components:      Result Value   WBC 10.6 (*)    All other components within normal limits  BASIC METABOLIC PANEL - Abnormal; Notable for the following components:   Glucose, Bld 123 (*)    All other components within normal limits  HCG, QUANTITATIVE, PREGNANCY - Abnormal; Notable for the following components:   hCG, Beta Chain, Quant, [place] 45,409 (*)    All other components within normal limits     EKG     RADIOLOGY Please see ED Course for my review and interpretation.  I personally reviewed all radiographic images ordered to evaluate for the above acute complaints and reviewed radiology reports and findings.  These findings were personally discussed with the patient.  Please see medical record for radiology report.    PROCEDURES:  Critical Care performed: No  Procedures   MEDICATIONS ORDERED IN ED: Medications - No data to display   IMPRESSION / MDM / ASSESSMENT AND PLAN / ED COURSE  I reviewed the triage vital signs and the nursing notes.                              Differential diagnosis includes, but is not limited to, ectopic, threatened AB, miscarriage, AUB, subchorionic hematoma  Patient presented to the ER for evaluation of vaginal bleeding and cramping in early pregnancy as described above.  This presenting complaint could reflect a potentially life-threatening illness therefore [place] and laboratory evaluation will be sent to evaluate for the above complaints.      Clinical Course as of 09/29/21 1346  Sat Sep 29, 2021  1328 Ultrasound by my interpretation shows evidence of IUP with fetal cardiac activity. [PR]  1345 Ultrasound is reassuring with.  Discussed presentation concerning for threatened miscarriage but work-up thus far is reassuring patient stable and appropriate  for outpatient follow-up with OB/GYN.  Discussed conservative management and rest.  Discussed return precautions.  Patient agreeable to plan. [PR]    Clinical Course User Index [PR] Willy Eddy, MD     FINAL CLINICAL IMPRESSION(S) / ED DIAGNOSES   Final diagnoses:  Vaginal bleeding in pregnancy     Rx / DC Orders   ED Discharge Orders     None        Note:  This document was prepared using Dragon voice recognition software and may include unintentional dictation errors.    Willy Eddy, MD 09/29/21 1346

## 2021-10-22 ENCOUNTER — Telehealth: Payer: Self-pay | Admitting: Advanced Practice Midwife

## 2021-10-22 ENCOUNTER — Ambulatory Visit: Payer: Managed Care, Other (non HMO)

## 2021-10-22 NOTE — Telephone Encounter (Signed)
Called pt to reschedule NEW OB intake appt. that was scheduled for Monday, 7/17.  Left message for pt to call back to reschedule.

## 2021-10-29 NOTE — Telephone Encounter (Signed)
Patient is scheduled for 10/22/21 for new ob intake

## 2021-10-29 NOTE — Telephone Encounter (Signed)
Contacted patient about rescheduling missed appointment for 10/22/21

## 2021-10-30 ENCOUNTER — Other Ambulatory Visit: Payer: Managed Care, Other (non HMO)

## 2021-10-31 NOTE — Telephone Encounter (Signed)
Patient's appointment has be rescheduled for NOB intake to 11/02/21

## 2021-11-02 ENCOUNTER — Ambulatory Visit: Payer: Managed Care, Other (non HMO)

## 2021-11-02 ENCOUNTER — Telehealth: Payer: Self-pay | Admitting: Licensed Practical Nurse

## 2021-11-02 NOTE — Progress Notes (Signed)
Called pt to reschedule the NOB intake appt.  No answer, left message for pt to call back.

## 2021-11-02 NOTE — Telephone Encounter (Signed)
Called pt to reschedule NOB intake appt.  No answer.  Left message for pt to call back.

## 2021-11-06 ENCOUNTER — Encounter: Payer: Managed Care, Other (non HMO) | Admitting: Advanced Practice Midwife

## 2021-11-06 ENCOUNTER — Encounter: Payer: Self-pay | Admitting: Advanced Practice Midwife

## 2021-11-06 NOTE — Telephone Encounter (Signed)
Contacted pt to reschedule New OB nurse intake appt that was scheduled for 11/02/21.  Left message for pt to call back to schedule.  Also sending a Press photographer.

## 2021-11-20 NOTE — Progress Notes (Unsigned)
Pt has been seen at [facility].

## 2022-04-08 NOTE — L&D Delivery Note (Signed)
Delivery Note  First Stage: Labor onset: 2027 w/ SROM INduction: cytotec, Cook cath, Pitocin Analgesia /Anesthesia intrapartum: epidural SROM at 2027  Second Stage: Complete dilation at 2240 Onset of pushing at 2245 FHR second stage Cat II variables, mod variability  Delivery of a viable female infant on 05/04/22 at 2306 by CNM delivery of fetal head in LOA position with restitution to LOT. No nuchal cord;  Anterior then posterior shoulders delivered easily with gentle downward traction. Baby placed on mom's chest, and attended to by peds.  Cord double clamped after cessation of pulsation, cut by FOB   Third Stage: Placenta delivered spontaneously intact with 3VC @ 2311 Placenta disposition: routine disposal Uterine tone Firm / bleeding scant  Perineal midline abrasion noted, Hemostatic and no repair needed.   Est. Blood Loss (mL): 250  Complications: none  Mom to postpartum.  Baby to Couplet care / Skin to Skin.  Newborn: Birth Weight: pending  Apgar Scores: 8/8 Feeding planned: breast

## 2022-04-16 ENCOUNTER — Encounter: Payer: Managed Care, Other (non HMO) | Attending: Certified Nurse Midwife | Admitting: *Deleted

## 2022-04-16 ENCOUNTER — Encounter: Payer: Self-pay | Admitting: *Deleted

## 2022-04-16 VITALS — BP 104/70 | Ht 64.0 in | Wt 216.4 lb

## 2022-04-16 DIAGNOSIS — O99213 Obesity complicating pregnancy, third trimester: Secondary | ICD-10-CM | POA: Insufficient documentation

## 2022-04-16 DIAGNOSIS — Z3A00 Weeks of gestation of pregnancy not specified: Secondary | ICD-10-CM | POA: Insufficient documentation

## 2022-04-16 DIAGNOSIS — O24419 Gestational diabetes mellitus in pregnancy, unspecified control: Secondary | ICD-10-CM | POA: Insufficient documentation

## 2022-04-16 DIAGNOSIS — O24410 Gestational diabetes mellitus in pregnancy, diet controlled: Secondary | ICD-10-CM

## 2022-04-16 NOTE — Progress Notes (Signed)
Diabetes Self-Management Education  Visit Type: First/Initial  Appt. Start Time: 1325 Appt. End Time: 7322  04/16/2022  [name], identified by name and date of birth, is a 37 y.o. female with a diagnosis of Diabetes: Gestational Diabetes.   ASSESSMENT  Blood pressure 104/70, height '5\' 4"'$  (1.626 m), weight 216 lb 6.4 oz (98.2 kg), last menstrual period 08/06/2021, estimated date of delivery 05/14/2022 Body mass index is 37.14 kg/m.   Diabetes Self-Management Education - 04/16/22 1500       Visit Information   Visit Type First/Initial      Initial Visit   Diabetes Type Gestational Diabetes    Date Diagnosed December 2023    Are you currently following a meal plan? No   craving more sweets   Are you taking your medications as prescribed? Yes      Health Coping   How would you rate your overall health? Fair      Psychosocial Assessment   Patient Belief/Attitude about Diabetes Other (comment)   "alright"   What is the hardest part about your diabetes right now, causing you the most concern, or is the most worrisome to you about your diabetes?   Making healty food and beverage choices    Self-care barriers None    Self-management support Doctor's office;Family    Patient Concerns Nutrition/Meal planning;Weight Control;Glycemic Control;Healthy Lifestyle    Special Needs None    Preferred Learning Style Visual;Other (comment)   talking/discussion   Learning Readiness Ready    How often do you need to have someone help you when you read instructions, pamphlets, or other written materials from your doctor or pharmacy? 1 - Never    What is the last grade level you completed in school? Bachelors      Pre-Education Assessment   Patient understands the diabetes disease and treatment process. Needs Instruction    Patient understands incorporating nutritional management into lifestyle. Needs Instruction    Patient undertands incorporating physical activity into lifestyle.  Needs Review    Patient understands using medications safely. Needs Instruction    Patient understands monitoring blood glucose, interpreting and using results Needs Instruction    Patient understands prevention, detection, and treatment of acute complications. Needs Instruction    Patient understands prevention, detection, and treatment of chronic complications. Needs Instruction    Patient understands how to develop strategies to address psychosocial issues. Needs Instruction    Patient understands how to develop strategies to promote health/change behavior. Needs Instruction      Complications   Last HgB A1C per patient/outside source 5.3 %   09/24/2021   How often do you check your blood sugar? 0 times/day (not testing)   Provided Accu-Chek Guide Me meter and instructed on use. BG upon return demonstration was 82 at 2:25 pm - 3 1/2 hrs pp.   Have you had a dilated eye exam in the past 12 months? No    Have you had a dental exam in the past 12 months? Yes    Are you checking your feet? Yes    How many days per week are you checking your feet? 7      Dietary Intake   Breakfast toasted 21 grain bread - 2 pieces with jelly and cream cheese; apple; breakfast burrito with egg, beans, spinach, onions; potatoes, cheese    Snack (morning) soup, fruit, veggies    Lunch pizza, burrito, tortilla with rice and beans    Snack (afternoon) mixed nuts with raisins, crackers, fruit (  orange, pear, pomegrande, banana) - juice from fruit    Dinner chicken, beef, fish; potatoes, peas, beans, corn, cauliflower, spinach, kale, cuccumbers, broccoli, zucchini, squash, bread, rice, pasta    Snack (evening) fries, shake - eats any kind of dessert    Beverage(s) water, sugar swetened tea      Activity / Exercise   Activity / Exercise Type ADL's      Patient Education   Previous Diabetes Education Yes (please comment)   online coaching from insurance [employer]   Disease Pathophysiology Definition of diabetes, type 1  and 2, and the diagnosis of diabetes;Factors that contribute to the development of diabetes    Healthy Eating Role of diet in the treatment of diabetes and the relationship between the three main macronutrients and blood glucose level;Food label reading, portion sizes and measuring food.;Reviewed blood glucose goals for pre and post meals and how to evaluate the patients' food intake on their blood glucose level.    Being Active Role of exercise on diabetes management, blood pressure control and cardiac health.    Medications Other (comment)   Limited use of oral medications during pregnancy and potential for insulin.   Monitoring Taught/evaluated SMBG meter.;Purpose and frequency of SMBG.;Taught/discussed recording of test results and interpretation of SMBG.;Identified appropriate SMBG and/or A1C goals.;Ketone testing, when, how.    Chronic complications Relationship between chronic complications and blood glucose control    Diabetes Stress and Support Identified and addressed patients feelings and concerns about diabetes    Preconception care Pregnancy and GDM  Role of pre-pregnancy blood glucose control on the development of the fetus;Reviewed with patient blood glucose goals with pregnancy;Role of family planning for patients with diabetes      Individualized Goals (developed by patient)   Reducing Risk Other (comment)   improve blood sugars, lose weight, lead a healthier lifestyle, become more fit     Outcomes   Expected Outcomes Demonstrated interest in learning. Expect positive outcomes         Individualized Plan for Diabetes Self-Management Training:   Learning Objective:  Patient will have a greater understanding of diabetes self-management. Patient education plan is to attend individual and/or group sessions per assessed needs and concerns.   Plan:   Patient Instructions  Read booklet on Gestational Diabetes Follow Gestational Meal Planning Guidelines Allow 2-3 hours between  meals and snacks Limit desserts and sweets Avoid sugar sweetened drinks When eating fruit for a snack - include 1 protein serving Complete a 3 Day Food Record and bring to next appointment Check blood sugars 4 x day - before breakfast and 2 hrs after every meal and record  Bring blood sugar log to all appointments Call MD for prescription for meter strips and lancets Strips   Accu-Chek Guide  Lancets   Accu-Chek Softclix Purchase urine ketone strips if instructed by MD and check urine ketones every am:  If + increase bedtime snack to 1 protein and 2 carbohydrate servings Walk 20-30 minutes at least 5 x week if permitted by MD  Expected Outcomes:  Demonstrated interest in learning. Expect positive outcomes  Education material provided:  Gestational Booklet Gestational Meal Planning Guidelines Simple Meal Plan Meter = Accu-Chek Guide Me 3 Day Food Record Goals for a Healthy Pregnancy  If problems or questions, patient to contact team via:   Johny Drilling, RN, [place], [place] [phone]  Future DSME appointment:  April 24, 2021 with the dietitian

## 2022-04-16 NOTE — Patient Instructions (Signed)
Read booklet on Gestational Diabetes Follow Gestational Meal Planning Guidelines Allow 2-3 hours between meals and snacks Limit desserts and sweets Avoid sugar sweetened drinks When eating fruit for a snack - include 1 protein serving Complete a 3 Day Food Record and bring to next appointment Check blood sugars 4 x day - before breakfast and 2 hrs after every meal and record  Bring blood sugar log to all appointments Call MD for prescription for meter strips and lancets Strips   Accu-Chek Guide  Lancets   Accu-Chek Softclix Purchase urine ketone strips if instructed by MD and check urine ketones every am:  If + increase bedtime snack to 1 protein and 2 carbohydrate servings Walk 20-30 minutes at least 5 x week if permitted by MD

## 2022-04-24 ENCOUNTER — Ambulatory Visit: Payer: Managed Care, Other (non HMO) | Admitting: Dietician

## 2022-05-02 ENCOUNTER — Other Ambulatory Visit: Payer: Self-pay | Admitting: Obstetrics and Gynecology

## 2022-05-02 DIAGNOSIS — O24419 Gestational diabetes mellitus in pregnancy, unspecified control: Secondary | ICD-10-CM

## 2022-05-02 NOTE — Progress Notes (Signed)
Dating: EDD: 05/14/22  by LMP: 08/07/21 and c/w [place] at 7+5 wks.   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

## 2022-05-04 ENCOUNTER — Encounter: Payer: Self-pay | Admitting: Obstetrics and Gynecology

## 2022-05-04 ENCOUNTER — Inpatient Hospital Stay: Payer: Managed Care, Other (non HMO) | Admitting: Certified Registered"

## 2022-05-04 ENCOUNTER — Inpatient Hospital Stay
Admission: EM | Admit: 2022-05-04 | Discharge: 2022-05-06 | DRG: 807 | Disposition: A | Discharge status: Home / Self Care | Payer: Managed Care, Other (non HMO) | Attending: Obstetrics and Gynecology | Admitting: Obstetrics and Gynecology

## 2022-05-04 ENCOUNTER — Other Ambulatory Visit: Payer: Self-pay

## 2022-05-04 DIAGNOSIS — O24420 Gestational diabetes mellitus in childbirth, diet controlled: Secondary | ICD-10-CM | POA: Diagnosis present

## 2022-05-04 DIAGNOSIS — Z3A38 38 weeks gestation of pregnancy: Secondary | ICD-10-CM

## 2022-05-04 DIAGNOSIS — O99214 Obesity complicating childbirth: Secondary | ICD-10-CM | POA: Diagnosis present

## 2022-05-04 DIAGNOSIS — O134 Gestational [pregnancy-induced] hypertension without significant proteinuria, complicating childbirth: Secondary | ICD-10-CM | POA: Diagnosis present

## 2022-05-04 DIAGNOSIS — D509 Iron deficiency anemia, unspecified: Secondary | ICD-10-CM | POA: Diagnosis present

## 2022-05-04 DIAGNOSIS — O9902 Anemia complicating childbirth: Secondary | ICD-10-CM | POA: Diagnosis present

## 2022-05-04 DIAGNOSIS — O24419 Gestational diabetes mellitus in pregnancy, unspecified control: Principal | ICD-10-CM | POA: Diagnosis present

## 2022-05-04 DIAGNOSIS — O24429 Gestational diabetes mellitus in childbirth, unspecified control: Secondary | ICD-10-CM | POA: Diagnosis present

## 2022-05-04 LAB — HEMOGLOBIN A1C
Hgb A1c MFr Bld: 6.2 % — ABNORMAL HIGH (ref 4.8–5.6)
Mean Plasma Glucose: 131.24 mg/dL

## 2022-05-04 LAB — CBC
HCT: 36.2 % (ref 36.0–46.0)
Hemoglobin: 11.7 g/dL — ABNORMAL LOW (ref 12.0–15.0)
MCH: 25.3 pg — ABNORMAL LOW (ref 26.0–34.0)
MCHC: 32.3 g/dL (ref 30.0–36.0)
MCV: 78.2 fL — ABNORMAL LOW (ref 80.0–100.0)
Platelets: 245 10*3/uL (ref 150–400)
RBC: 4.63 MIL/uL (ref 3.87–5.11)
RDW: 18.1 % — ABNORMAL HIGH (ref 11.5–15.5)
WBC: 9.8 10*3/uL (ref 4.0–10.5)
nRBC: 0 % (ref 0.0–0.2)

## 2022-05-04 LAB — PROTEIN / CREATININE RATIO, URINE
Creatinine, Urine: 91 mg/dL
Protein Creatinine Ratio: 0.25 mg/mg{Cre} — ABNORMAL HIGH (ref 0.00–0.15)
Total Protein, Urine: 23 mg/dL

## 2022-05-04 LAB — COMPREHENSIVE METABOLIC PANEL
ALT: 15 U/L (ref 0–44)
AST: 18 U/L (ref 15–41)
Albumin: 2.6 g/dL — ABNORMAL LOW (ref 3.5–5.0)
Alkaline Phosphatase: 171 U/L — ABNORMAL HIGH (ref 38–126)
Anion gap: 9 (ref 5–15)
BUN: 11 mg/dL (ref 6–20)
CO2: 20 mmol/L — ABNORMAL LOW (ref 22–32)
Calcium: 8.7 mg/dL — ABNORMAL LOW (ref 8.9–10.3)
Chloride: 103 mmol/L (ref 98–111)
Creatinine, Ser: 0.51 mg/dL (ref 0.44–1.00)
GFR, Estimated: >60 mL/min (ref >60)
Glucose, Bld: 85 mg/dL (ref 70–99)
Potassium: 4.1 mmol/L (ref 3.5–5.1)
Sodium: 132 mmol/L — ABNORMAL LOW (ref 135–145)
Total Bilirubin: 0.5 mg/dL (ref 0.3–1.2)
Total Protein: 6.6 g/dL (ref 6.5–8.1)

## 2022-05-04 LAB — TYPE AND SCREEN
ABO/RH(D): B POS
Antibody Screen: NEGATIVE

## 2022-05-04 LAB — GLUCOSE, CAPILLARY
Glucose-Capillary: 91 mg/dL (ref 70–99)
Glucose-Capillary: 143 mg/dL — ABNORMAL HIGH (ref 70–99)
Glucose-Capillary: 70 mg/dL (ref 70–99)
Glucose-Capillary: 116 mg/dL — ABNORMAL HIGH (ref 70–99)
Glucose-Capillary: 85 mg/dL (ref 70–99)

## 2022-05-04 MED ORDER — FENTANYL-BUPIVACAINE-NACL 0.5-0.125-0.9 MG/250ML-% EP SOLN
EPIDURAL | Status: AC
  Filled 2022-05-04: qty 250

## 2022-05-04 MED ORDER — ZOLPIDEM TARTRATE 5 MG PO TABS: 5.0000 mg | ORAL_TABLET | Freq: Every evening | ORAL | Status: DC | PRN

## 2022-05-04 MED ORDER — ACETAMINOPHEN 325 MG PO TABS
650.0000 mg | ORAL_TABLET | ORAL | Status: DC | PRN
  Administered 2022-05-05: 650 mg via ORAL
  Filled 2022-05-04 (×2): qty 2

## 2022-05-04 MED ORDER — TERBUTALINE SULFATE 1 MG/ML IJ SOLN: 0.2500 mg | Freq: Once | INTRAMUSCULAR | Status: DC | PRN

## 2022-05-04 MED ORDER — PHENYLEPHRINE 80 MCG/ML (10ML) SYRINGE FOR IV PUSH (FOR BLOOD PRESSURE SUPPORT): 80.0000 ug | PREFILLED_SYRINGE | INTRAVENOUS | Status: DC | PRN

## 2022-05-04 MED ORDER — BENZOCAINE-MENTHOL 20-0.5 % EX AERO
1.0000 | INHALATION_SPRAY | CUTANEOUS | Status: DC | PRN
  Filled 2022-05-04: qty 56

## 2022-05-04 MED ORDER — IBUPROFEN 600 MG PO TABS
600.0000 mg | ORAL_TABLET | Freq: Four times a day (QID) | ORAL | Status: DC
  Administered 2022-05-05 – 2022-05-06 (×6): 600 mg via ORAL
  Filled 2022-05-04 (×6): qty 1

## 2022-05-04 MED ORDER — OXYTOCIN-SODIUM CHLORIDE 30-0.9 UT/500ML-% IV SOLN
2.5000 [IU]/h | INTRAVENOUS | Status: DC
  Filled 2022-05-04: qty 500

## 2022-05-04 MED ORDER — ONDANSETRON HCL 4 MG/2ML IJ SOLN: 4.0000 mg | INTRAMUSCULAR | Status: DC | PRN

## 2022-05-04 MED ORDER — OXYTOCIN BOLUS FROM INFUSION: 333.0000 mL | Freq: Once | INTRAVENOUS | Status: DC

## 2022-05-04 MED ORDER — DIPHENHYDRAMINE HCL 25 MG PO CAPS: 25.0000 mg | ORAL_CAPSULE | Freq: Four times a day (QID) | ORAL | Status: DC | PRN

## 2022-05-04 MED ORDER — LIDOCAINE-EPINEPHRINE (PF) 1.5 %-1:200000 IJ SOLN
INTRAMUSCULAR | Status: DC | PRN
  Administered 2022-05-04: 3 mL via PERINEURAL

## 2022-05-04 MED ORDER — SENNOSIDES-DOCUSATE SODIUM 8.6-50 MG PO TABS
2.0000 | ORAL_TABLET | Freq: Every day | ORAL | Status: DC
  Administered 2022-05-05 – 2022-05-06 (×2): 2 via ORAL
  Filled 2022-05-04 (×2): qty 2

## 2022-05-04 MED ORDER — MISOPROSTOL 25 MCG QUARTER TABLET
25.0000 ug | ORAL_TABLET | Freq: Once | ORAL | Status: AC
  Administered 2022-05-04: 25 ug via VAGINAL
  Filled 2022-05-04: qty 1

## 2022-05-04 MED ORDER — FERROUS SULFATE 325 (65 FE) MG PO TABS
325.0000 mg | ORAL_TABLET | Freq: Two times a day (BID) | ORAL | Status: DC
  Administered 2022-05-05 – 2022-05-06 (×3): 325 mg via ORAL
  Filled 2022-05-04 (×3): qty 1

## 2022-05-04 MED ORDER — LACTATED RINGERS IV SOLN
500.0000 mL | INTRAVENOUS | Status: DC | PRN
  Administered 2022-05-04: 500 mL via INTRAVENOUS

## 2022-05-04 MED ORDER — FENTANYL-BUPIVACAINE-NACL 0.5-0.125-0.9 MG/250ML-% EP SOLN
EPIDURAL | Status: DC | PRN
  Administered 2022-05-04: 12 mL/h via EPIDURAL

## 2022-05-04 MED ORDER — LIDOCAINE HCL (PF) 1 % IJ SOLN
INTRAMUSCULAR | Status: DC | PRN
  Administered 2022-05-04: 3 mL

## 2022-05-04 MED ORDER — EPHEDRINE 5 MG/ML INJ: 10.0000 mg | INTRAVENOUS | Status: DC | PRN

## 2022-05-04 MED ORDER — SIMETHICONE 80 MG PO CHEW: 80.0000 mg | CHEWABLE_TABLET | ORAL | Status: DC | PRN

## 2022-05-04 MED ORDER — OXYTOCIN-SODIUM CHLORIDE 30-0.9 UT/500ML-% IV SOLN
1.0000 m[IU]/min | INTRAVENOUS | Status: DC
  Administered 2022-05-04: 2 m[IU]/min via INTRAVENOUS

## 2022-05-04 MED ORDER — MISOPROSTOL 25 MCG QUARTER TABLET
25.0000 ug | ORAL_TABLET | Freq: Once | ORAL | Status: AC
  Administered 2022-05-04: 25 ug via ORAL
  Filled 2022-05-04: qty 1

## 2022-05-04 MED ORDER — WITCH HAZEL-GLYCERIN EX PADS
1.0000 | MEDICATED_PAD | CUTANEOUS | Status: DC | PRN
  Administered 2022-05-06: 1 via TOPICAL
  Filled 2022-05-04 (×2): qty 100

## 2022-05-04 MED ORDER — AMMONIA AROMATIC IN INHA
RESPIRATORY_TRACT | Status: AC
  Filled 2022-05-04: qty 10

## 2022-05-04 MED ORDER — ONDANSETRON HCL 4 MG PO TABS: 4.0000 mg | ORAL_TABLET | ORAL | Status: DC | PRN

## 2022-05-04 MED ORDER — FENTANYL CITRATE (PF) 100 MCG/2ML IJ SOLN: 50.0000 ug | INTRAMUSCULAR | Status: DC | PRN

## 2022-05-04 MED ORDER — FENTANYL-BUPIVACAINE-NACL 0.5-0.125-0.9 MG/250ML-% EP SOLN: 12.0000 mL/h | EPIDURAL | Status: DC | PRN

## 2022-05-04 MED ORDER — LIDOCAINE HCL (PF) 1 % IJ SOLN
30.0000 mL | INTRAMUSCULAR | Status: DC | PRN
  Filled 2022-05-04: qty 30

## 2022-05-04 MED ORDER — BUPIVACAINE HCL (PF) 0.25 % IJ SOLN
INTRAMUSCULAR | Status: DC | PRN
  Administered 2022-05-04 (×2): 5 mL via EPIDURAL

## 2022-05-04 MED ORDER — INSULIN ASPART 100 UNIT/ML IJ SOLN: 0.0000 [IU] | Freq: Three times a day (TID) | INTRAMUSCULAR | Status: DC

## 2022-05-04 MED ORDER — ACETAMINOPHEN 325 MG PO TABS: 650.0000 mg | ORAL_TABLET | ORAL | Status: DC | PRN

## 2022-05-04 MED ORDER — DIPHENHYDRAMINE HCL 50 MG/ML IJ SOLN: 12.5000 mg | INTRAMUSCULAR | Status: DC | PRN

## 2022-05-04 MED ORDER — LACTATED RINGERS IV SOLN: INTRAVENOUS | Status: DC

## 2022-05-04 MED ORDER — PRENATAL MULTIVITAMIN CH
1.0000 | ORAL_TABLET | Freq: Every day | ORAL | Status: DC
  Administered 2022-05-05 – 2022-05-06 (×2): 1 via ORAL
  Filled 2022-05-04 (×2): qty 1

## 2022-05-04 MED ORDER — LACTATED RINGERS IV SOLN: 500.0000 mL | Freq: Once | INTRAVENOUS | Status: DC

## 2022-05-04 MED ORDER — COCONUT OIL OIL
1.0000 | TOPICAL_OIL | Status: DC | PRN
  Administered 2022-05-06: 1 via TOPICAL
  Filled 2022-05-04: qty 7.5

## 2022-05-04 MED ORDER — ONDANSETRON HCL 4 MG/2ML IJ SOLN: 4.0000 mg | Freq: Four times a day (QID) | INTRAMUSCULAR | Status: DC | PRN

## 2022-05-04 MED ORDER — SOD CITRATE-CITRIC ACID 500-334 MG/5ML PO SOLN: 30.0000 mL | ORAL | Status: DC | PRN

## 2022-05-04 MED ORDER — DIBUCAINE (PERIANAL) 1 % EX OINT
1.0000 | TOPICAL_OINTMENT | CUTANEOUS | Status: DC | PRN
  Filled 2022-05-04: qty 28

## 2022-05-04 MED ORDER — OXYTOCIN 10 UNIT/ML IJ SOLN
INTRAMUSCULAR | Status: AC
  Filled 2022-05-04: qty 2

## 2022-05-04 MED ORDER — MISOPROSTOL 200 MCG PO TABS
ORAL_TABLET | ORAL | Status: AC
  Filled 2022-05-04: qty 4

## 2022-05-04 NOTE — Anesthesia Preprocedure Evaluation (Signed)
Anesthesia Evaluation  Patient identified by MRN, date of birth, ID band Patient awake    Reviewed: Allergy & Precautions, H&P , NPO status , Patient's Chart, lab work & pertinent test results  Airway Mallampati: III  TM Distance: >3 FB     Dental no notable dental hx.    Pulmonary neg pulmonary ROS          Cardiovascular negative cardio ROS      Neuro/Psych negative neurological ROS  negative psych ROS   GI/Hepatic Neg liver ROS,GERD  ,,  Endo/Other  diabetes    Renal/GU negative Renal ROS  negative genitourinary   Musculoskeletal   Abdominal   Peds  Hematology negative hematology ROS (+)   Anesthesia Other Findings   Reproductive/Obstetrics (+) Pregnancy                             Anesthesia Physical Anesthesia Plan  ASA: 3  Anesthesia Plan: Epidural   Post-op Pain Management:    Induction:   PONV Risk Score and Plan:   Airway Management Planned:   Additional Equipment:   Intra-op Plan:   Post-operative Plan:   Informed Consent: I have reviewed the patients History and Physical, chart, labs and discussed the procedure including the risks, benefits and alternatives for the proposed anesthesia with the patient or authorized representative who has indicated his/her understanding and acceptance.       Plan Discussed with: Anesthesiologist and CRNA  Anesthesia Plan Comments:        Anesthesia Quick Evaluation

## 2022-05-04 NOTE — Discharge Summary (Signed)
Obstetrical Discharge Summary  Patient Name: Judy Gomez DOB: 11/07/1985 MRN: [id number]  Date of Admission: 05/04/2022 Date of Delivery: 05/04/22 Delivered by: Magda Kiel CNM  Date of Discharge: 05/06/2022  Primary OB: [facility] [id number] last menstrual period was 08/06/2021 (exact date). 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  Admitting Diagnosis: Gestational diabetes mellitus (GDM) in third trimester [O24.419]  Secondary Diagnosis: SVD Patient Active Problem List   Diagnosis Date Noted   Gestational diabetes mellitus (GDM) in third trimester 05/04/2022   Postoperative state 08/09/2021   Post-operative state 08/09/2021   Supervision of other normal pregnancy, antepartum 04/18/2020   Complex ovarian cyst 01/10/2015   Low vitamin D level 01/10/2015    Discharge Diagnosis: Term Pregnancy Delivered, Gestational Hypertension, and GDM A1      Augmentation: Pitocin, Cytotec, and Cook Cath Complications: None Intrapartum complications/course: admitted for IOL due to GDM with poor control, GHTN dx with mild range BP on admission; cytotec, then cook cath with Pitocin. SROM and transition to active labor, progressed to C/C/+2 with effective pushing and SVD Delivery Type: spontaneous vaginal delivery Anesthesia: epidural anesthesia Placenta: spontaneous To Pathology: No  Laceration: none Episiotomy: none Newborn Data: Live born female  Birth Weight:  8lb 2.5oz APGAR: 8, 8  Newborn Delivery   Birth date/time: 05/04/2022 23:06:29 Delivery type: Vaginal, Spontaneous     Postpartum Procedures: none Edinburgh:     05/05/2022    9:00 PM  Edinburgh Postnatal Depression Scale Screening Tool  I  have been able to laugh and see the funny side of things. 0  I have looked forward with enjoyment to things. 0  I have blamed myself unnecessarily when things went wrong. 0  I have been anxious or worried for no good reason. 0  I have felt scared or panicky for no good reason. 0  Things have been getting on top of me. 3  I have been so unhappy that I have had difficulty sleeping. 3  I have felt sad or miserable. 0  I have been so unhappy that I have been crying. 1  The thought of harming myself has occurred to me. 0  Edinburgh Postnatal Depression Scale Total 7     Post partum course:  Patient had an uncomplicated postpartum course.  By time of discharge on PPD#2, her pain was controlled on oral pain medications; she had appropriate lochia and was ambulating, voiding without difficulty and tolerating regular diet.  She was deemed stable for discharge to home.     Discharge Physical Exam:   BP 132/70 (BP Location: Left Arm)   Pulse 92   Temp 97.6 F (36.4 C) (Oral)   Resp 18   Ht '5\' 4"'$  (1.626 m)   Wt 98.4 kg   LMP 08/06/2021 (Exact Date)   SpO2 98%   Breastfeeding Unknown   BMI 37.25 kg/m   General: NAD CV: RRR Pulm: CTABL, nl effort ABD: s/nd/nt, fundus firm and below the umbilicus Lochia: moderate Perineum: minimal edema/intact DVT Evaluation: LE non-ttp, no evidence of DVT on exam.  Hemoglobin  Date Value Ref Range Status  05/05/2022 10.9 (L) 12.0 - 15.0 g/dL Final  04/28/2020 12.1 11.1 - 15.9 g/dL Final   HCT  Date Value Ref Range Status  05/05/2022 33.7 (  L) 36.0 - 46.0 % Final   Hematocrit  Date Value Ref Range Status  04/28/2020 37.6 34.0 - 46.6 % Final    Risk assessment for postpartum VTE and prophylactic treatment: Very high risk factors: None High risk factors: None Moderate risk factors: BMI 30-40 kg/m2  Postpartum VTE prophylaxis with LMWH not indicated  Disposition: stable, discharge to home. Baby Feeding: breast feeding Baby Disposition: home  with mom  Rh Immune globulin indicated: No Rubella vaccine given: was not indicated Varivax vaccine given: was not indicated Flu vaccine given in AP setting: declined Tdap vaccine given in AP setting: declined  Contraception:  Nexplanon  Prenatal Labs:  Blood type/Rh  B POS  Antibody screen neg  Rubella Immune  Varicella Immune  RPR NR  HBsAg Neg  HIV NR  GC neg  Chlamydia neg  Genetic screening negative  1 hour GTT 144  3 hour GTT  [phone]  GBS Neg     Plan:  Judy Gomez was discharged to home in good condition. Follow-up appointment with delivering provider in 6 weeks. Will need postpartum glucose test.   Discharge Medications: Allergies as of 05/06/2022   No Known Allergies      Medication List     STOP taking these medications    aspirin EC 81 MG tablet       TAKE these medications    acetaminophen 325 MG tablet Commonly known as: Tylenol Take 2 tablets (650 mg total) by mouth every 4 (four) hours as needed (for pain scale < 4).   benzocaine-Menthol 20-0.5 % Aero Commonly known as: DERMOPLAST Apply 1 Application topically as needed for irritation (perineal discomfort).   ferrous sulfate 325 (65 FE) MG tablet Take 1 tablet (325 mg total) by mouth 2 (two) times daily with a meal. What changed:  medication strength how much to take when to take this   ibuprofen 600 MG tablet Commonly known as: ADVIL Take 1 tablet (600 mg total) by mouth every 6 (six) hours.   multivitamin-prenatal 27-0.8 MG Tabs tablet Take 1 tablet by mouth daily at 12 noon.   ondansetron 4 MG tablet Commonly known as: Zofran Take 1 tablet (4 mg total) by mouth every 8 (eight) hours as needed for nausea or vomiting.   Vitamin D (Ergocalciferol) 1.25 MG (50000 UNIT) Caps capsule Commonly known as: DRISDOL Take 50,000 Units by mouth once a week.   witch hazel-glycerin pad Commonly known as: TUCKS Apply 1 Application topically as needed for hemorrhoids.          Follow-up Information     Areli Jowett, Murray Hodgkins, CNM Follow up in 6 week(s).   Specialty: Obstetrics and Gynecology Why: routine Postpartum visit Contact information: [address] [place] [id number] [phone]                 Signed: Gertie Fey, [place] 05/06/2022 10:00 AM

## 2022-05-04 NOTE — Anesthesia Procedure Notes (Addendum)
Epidural Patient location during procedure: OB Start time: 05/04/2022 9:16 PM End time: 05/04/2022 9:24 PM  Staffing Anesthesiologist: Ilene Qua, MD Performed: anesthesiologist   Preanesthetic Checklist Completed: patient identified, IV checked, site marked, risks and benefits discussed, surgical consent, monitors and equipment checked, pre-op evaluation and timeout performed  Epidural Patient position: sitting Prep: ChloraPrep Patient monitoring: heart rate, continuous pulse ox and blood pressure Approach: midline Location: L3-L4 Injection technique: LOR saline  Needle:  Needle type: Tuohy  Needle gauge: 17 G Needle length: 9 cm and 9 Needle insertion depth: 7.5 cm Catheter type: closed end flexible Catheter size: 19 Gauge Catheter at skin depth: 13 cm Test dose: negative and 1.5% lidocaine with Epi 1:200 K  Assessment Sensory level: T10 Events: blood not aspirated, no cerebrospinal fluid, injection not painful, no injection resistance, no paresthesia and negative IV test  Additional Notes 1 attempt Pt. Evaluated and documentation done after procedure finished. Patient identified. Risks/Benefits/Options discussed with patient including but not limited to bleeding, infection, nerve damage, paralysis, failed block, incomplete pain control, headache, blood pressure changes, nausea, vomiting, reactions to medication both or allergic, itching and postpartum back pain. Confirmed with bedside nurse the patient's most recent platelet count. Confirmed with patient that they are not currently taking any anticoagulation, have any bleeding history or any family history of bleeding disorders. Patient expressed understanding and wished to proceed. All questions were answered. Sterile technique was used throughout the entire procedure. Please see nursing notes for vital signs. Test dose was given through epidural catheter and negative prior to continuing to dose epidural or start infusion.  Warning signs of high block given to the patient including shortness of breath, tingling/numbness in hands, complete motor block, or any concerning symptoms with instructions to call for help. Patient was given instructions on fall risk and not to get out of bed. All questions and concerns addressed with instructions to call with any issues or inadequate analgesia.    Patient tolerated the insertion well without immediate complications.Reason for block:procedure for pain

## 2022-05-04 NOTE — Progress Notes (Signed)
Labor Progress Note  Judy Gomez is a 37 y.o. [id number] at 22w4dby LMP admitted for induction of labor due to Gestational diabetes.   Subjective: feeling mild occasional pressure. No HA, VD or RUQ pain.   Objective: BP (!) 137/91   Pulse (!) 119   Temp 98.3 F (36.8 C) (Oral)   Resp 18   LMP 08/06/2021 (Exact Date)  Notable VS details: reviewed.   Vitals:   05/04/22 0615 05/04/22 0839 05/04/22 1150  BP: (!) 140/85 129/85 (!) 137/91     Fetal Assessment: FHT:  FHR: 150 bpm, variability: moderate,  accelerations:  Present,  decelerations:  Absent Category/reactivity:  Category I UC:   irregular, every 3-6 minutes, palpate Mild.   SVE:   3/50/-3, soft, very posterior.  - no bloody show - Cook Cath placed, pt tolerated well.   Membrane status: intact Amniotic color: n/a  Labs: Lab Results  Component Value Date   WBC 9.8 05/04/2022   HGB 11.7 (L) 05/04/2022   HCT 36.2 05/04/2022   MCV 78.2 (L) 05/04/2022   PLT 245 05/04/2022   Component     Latest Ref Rng 05/04/2022  Sodium     135 - 145 mmol/L 132 (L)   Potassium     3.5 - 5.1 mmol/L 4.1   Chloride     98 - 111 mmol/L 103   CO2     22 - 32 mmol/L 20 (L)   Glucose     70 - 99 mg/dL 85   BUN     6 - 20 mg/dL 11   Creatinine     0.44 - 1.00 mg/dL 0.51   Calcium     8.9 - 10.3 mg/dL 8.7 (L)   Total Protein     6.5 - 8.1 g/dL 6.6   Albumin     3.5 - 5.0 g/dL 2.6 (L)   AST     15 - 41 U/L 18   ALT     0 - 44 U/L 15   Alkaline Phosphatase     38 - 126 U/L 171 (H)   Total Bilirubin     0.3 - 1.2 mg/dL 0.5   GFR, Estimated     >60 mL/min >60   Anion gap     5 - 15  9   Creatinine, Urine     mg/dL 91   Total Protein, Urine     mg/dL 23   Protein Creatinine Ratio     0.00 - 0.15 mg/mg 0.25 (H)   Hemoglobin A1C     4.8 - 5.6 % 6.2 (H)   Mean Plasma Glucose     mg/dL 131.24   Glucose-Capillary     70 - 99 mg/dL 143 (H)   Glucose-Capillary      91     Legend: (L) Low (H)  High  Assessment / Plan: [id number]- poor control GHTN- new dx, 2 elevated BP since admission.    Labor: s/p cytotec x 2, feeling irreg UCs but mild. Cook cath placed now, will start Pitocin at 4hrs s/p cytotec dose. Plan AROM when fetus is applied in pelvis.  GDMA1: initial cbg 91, repeat 143 after having lunch brought from home- change to clear liquids now. CBG q2hr Preeclampsia:  stable labs, BP mild range, asymptomatic.  Fetal Wellbeing:  Category I Pain Control:  Labor support without medications I/D:   GBS neg Anticipated MOD:  NSVD  ReFrancetta FoundCNM 05/04/2022, 4:47  PM

## 2022-05-04 NOTE — H&P (Signed)
OB History & Physical   History of Present Illness:  Chief Complaint: induction  HPI:  Judy Gomez is a 37 y.o. [id number] female at 66w4ddated by LMP and c/w [place] 7+5wks, EDD 05/14/22.  She presents to L&D for IOL due to poor control of GDM  Active FM, denies UCs, LOF or VB.     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 Authorizing Provider  aspirin EC 81 MG tablet Take 81 mg by mouth daily. Patient not taking: Reported on 04/16/2022    [provider]  Ferrous Sulfate (IRON PO) Take 1 tablet by mouth every other day.    [provider]  ondansetron (ZOFRAN) 4 MG tablet Take 1 tablet (4 mg total) by mouth every 8 (eight) hours as needed for nausea or vomiting. Patient not taking: Reported on 04/16/2022 08/10/21 08/10/22  Schermerhorn, TGwen Her MD  Prenatal Vit-Fe Fumarate-FA (MULTIVITAMIN-PRENATAL) 27-0.8 MG TABS tablet Take 1 tablet by mouth daily at 12 noon.    [provider]  Vitamin D, Ergocalciferol, (DRISDOL) 1.25 MG (50000 UNIT) CAPS capsule Take 50,000 Units by mouth once a week. 06/21/21   [provider]     [facility] care site: [facility]  Social History: She  reports that she has  never smoked. She has never used smokeless tobacco. She reports that she does not drink alcohol and does not use drugs.  Family History: family history includes Hypertension in her father.   Review of Systems: A full review of systems was performed and negative except as noted in the HPI.     Physical Exam:  Vital Signs: BP 129/85 (BP Location: Left Arm)   Pulse 88   Temp 98 F (36.7 C) (Oral)   Resp 18   LMP 08/06/2021 (Exact Date)   General: no acute distress.  HEENT: normocephalic, atraumatic Heart: regular rate & rhythm.  No murmurs/rubs/gallops Lungs: clear to auscultation bilaterally, normal respiratory effort Abdomen: soft, gravid, non-tender;  EFW: 8lbs Pelvic:   External: Normal external female genitalia  Cervix: Dilation: 1 / Effacement (%): Thick / Station: Ballotable    Extremities: non-tender, symmetric, no edema bilaterally.  DTRs: 2+  Neurologic: Alert & oriented x 3.    Results for orders placed or performed during the hospital encounter of 05/04/22 (from the past 24 hour(s))  Glucose, capillary     Status: None   Collection Time: 05/04/22  8:41 AM  Result Value Ref Range   Glucose-Capillary 91 70 - 99 mg/dL  CBC     Status: Abnormal   Collection Time: 05/04/22  8:43 AM  Result Value Ref Range   WBC 9.8 4.0 - 10.5 K/uL  RBC 4.63 3.87 - 5.11 MIL/uL   Hemoglobin 11.7 (L) 12.0 - 15.0 g/dL   HCT 36.2 36.0 - 46.0 %   MCV 78.2 (L) 80.0 - 100.0 fL   MCH 25.3 (L) 26.0 - 34.0 pg   MCHC 32.3 30.0 - 36.0 g/dL   RDW 18.1 (H) 11.5 - 15.5 %   Platelets 245 150 - 400 K/uL   nRBC 0.0 0.0 - 0.2 %  Type and screen     Status: None (Preliminary result)   Collection Time: 05/04/22  8:43 AM  Result Value Ref Range   ABO/RH(D) PENDING    Antibody Screen PENDING    Sample Expiration      05/07/2022,2359 Performed at [facility], [address] [place], [place] [address]   Comprehensive metabolic panel     Status: Abnormal   Collection Time: 05/04/22  8:43  AM  Result Value Ref Range   Sodium 132 (L) 135 - 145 mmol/L   Potassium 4.1 3.5 - 5.1 mmol/L   Chloride 103 98 - 111 mmol/L   CO2 20 (L) 22 - 32 mmol/L   Glucose, Bld 85 70 - 99 mg/dL   BUN 11 6 - 20 mg/dL   Creatinine, Ser 0.51 0.44 - 1.00 mg/dL   Calcium 8.7 (L) 8.9 - 10.3 mg/dL   Total Protein 6.6 6.5 - 8.1 g/dL   Albumin 2.6 (L) 3.5 - 5.0 g/dL   AST 18 15 - 41 U/L   ALT 15 0 - 44 U/L   Alkaline Phosphatase 171 (H) 38 - 126 U/L   Total Bilirubin 0.5 0.3 - 1.2 mg/dL   GFR, Estimated >60 >60 mL/min   Anion gap 9 5 - 15  Protein / creatinine ratio, urine     Status: Abnormal   Collection Time: 05/04/22  8:43 AM  Result Value Ref Range   Creatinine, Urine 91 mg/dL   Total Protein, Urine 23 mg/dL   Protein Creatinine Ratio 0.25 (H) 0.00 - 0.15 mg/mg[Cre]    Pertinent Results:  Prenatal Labs: Blood type/Rh  B POS  Antibody screen neg  Rubella Immune  Varicella Immune  RPR NR  HBsAg Neg  HIV NR  GC neg  Chlamydia neg  Genetic screening negative  1 hour GTT 144  3 hour GTT  [phone]  GBS Neg    FHT: 140 bpm, mod var, + accels, no decels TOCO: Irreg UCs with UI SVE:  Dilation: 1 / Effacement (%): Thick / Station: Ballotable   - cervix soft and very posterior.   Cephalic by leopolds, confirmed with bedside US  No results found.  Assessment:  Judy Gomez is a 37 y.o. [phone] female at 55w4dwith A1GDM poor control.   Plan:  1. Admit to Labor & Delivery; consents reviewed and obtained - Dr BLeafy Ronotified of admission.   2. Fetal Well being  - Fetal Tracing: Cat I - Group B Streptococcus ppx indicated: Neg - Presentation: cephalic confirmed by exam   3. Routine OB: - Prenatal labs reviewed, as above - Rh B Pos - CBC, T&S, RPR on admit - Clear fluids, IVF  4. Induction of Labor -  Contractions: external toco in place -  Pelvis proven to 3790 -  Plan for induction with cytotec, Pitocin, AROM -  Plan for continuous fetal monitoring  -   Maternal pain control as desired - Anticipate vaginal delivery  5. Post Partum Planning: - Infant feeding: breast - Contraception: TBD - vaccines: Flu and Tdap  declined  Francetta Found, CNM 05/04/22 9:42 AM

## 2022-05-04 NOTE — Progress Notes (Signed)
Labor Progress Note  Judy Gomez is a 37 y.o. [id number] at 9w4dby LMP admitted for induction of labor due to Gestational diabetes.   Subjective:  cramping and leaking fluid,  No HA, VD or RUQ pain.   Objective: BP (!) 146/87   Pulse 96   Temp 98.3 F (36.8 C) (Oral)   Resp 18   Ht '5\' 4"'$  (1.626 m)   Wt 98.4 kg   LMP 08/06/2021 (Exact Date)   BMI 37.25 kg/m  Notable VS details: reviewed.   Vitals:   05/04/22 0615 05/04/22 0839 05/04/22 1150 05/04/22 1659  BP: (!) 140/85 129/85 (!) 137/91 (!) 146/87     Fetal Assessment: FHT:  FHR: 150 bpm, variability: moderate,  accelerations:  Present,  decelerations:  Absent Category/reactivity:  Category I UC:   irregular, every 1-2 minutes, PItocin at 142mmin  SVE:   5/50/-2, soft, very posterior.  - POS bloody show - Cook Cath removed intact  Membrane status: SROM at 2027 Amniotic color:clear, large amt  Labs: Lab Results  Component Value Date   WBC 9.8 05/04/2022   HGB 11.7 (L) 05/04/2022   HCT 36.2 05/04/2022   MCV 78.2 (L) 05/04/2022   PLT 245 05/04/2022   Component     Latest Ref Rng 05/04/2022  Sodium     135 - 145 mmol/L 132 (L)   Potassium     3.5 - 5.1 mmol/L 4.1   Chloride     98 - 111 mmol/L 103   CO2     22 - 32 mmol/L 20 (L)   Glucose     70 - 99 mg/dL 85   BUN     6 - 20 mg/dL 11   Creatinine     0.44 - 1.00 mg/dL 0.51   Calcium     8.9 - 10.3 mg/dL 8.7 (L)   Total Protein     6.5 - 8.1 g/dL 6.6   Albumin     3.5 - 5.0 g/dL 2.6 (L)   AST     15 - 41 U/L 18   ALT     0 - 44 U/L 15   Alkaline Phosphatase     38 - 126 U/L 171 (H)   Total Bilirubin     0.3 - 1.2 mg/dL 0.5   GFR, Estimated     >60 mL/min >60   Anion gap     5 - 15  9   Creatinine, Urine     mg/dL 91   Total Protein, Urine     mg/dL 23   Protein Creatinine Ratio     0.00 - 0.15 mg/mg 0.25 (H)   Hemoglobin A1C     4.8 - 5.6 % 6.2 (H)   Mean Plasma Glucose     mg/dL 131.24   Glucose-Capillary     70 - 99  mg/dL 143 (H)   Glucose-Capillary      91     Legend: (L) Low (H) High  Assessment / Plan: [id number]- poor control prior to admission GHTN- mild range elevated BP since admission.    Labor: s/p cytotec x 2, Cook cath now out with SROM, titrate Pitocin  GDMA1: on clear liquids now. CBG q2hr, last 116 Preeclampsia:  stable labs, BP mild range, asymptomatic.  Fetal Wellbeing:  Category I Pain Control:  Labor support without medications I/D:   GBS neg Anticipated MOD:  NSVD  RebMurray HodgkinsVey, CNM 05/04/2022, 8:40 PM

## 2022-05-05 ENCOUNTER — Encounter: Payer: Self-pay | Admitting: Obstetrics and Gynecology

## 2022-05-05 LAB — CBC
HCT: 33.7 % — ABNORMAL LOW (ref 36.0–46.0)
Hemoglobin: 10.9 g/dL — ABNORMAL LOW (ref 12.0–15.0)
MCH: 25.3 pg — ABNORMAL LOW (ref 26.0–34.0)
MCHC: 32.3 g/dL (ref 30.0–36.0)
MCV: 78.2 fL — ABNORMAL LOW (ref 80.0–100.0)
Platelets: 217 10*3/uL (ref 150–400)
RBC: 4.31 MIL/uL (ref 3.87–5.11)
RDW: 18 % — ABNORMAL HIGH (ref 11.5–15.5)
WBC: 13.6 10*3/uL — ABNORMAL HIGH (ref 4.0–10.5)
nRBC: 0 % (ref 0.0–0.2)

## 2022-05-05 LAB — GLUCOSE, CAPILLARY
Glucose-Capillary: 70 mg/dL (ref 70–99)
Glucose-Capillary: 100 mg/dL — ABNORMAL HIGH (ref 70–99)
Glucose-Capillary: 118 mg/dL — ABNORMAL HIGH (ref 70–99)
Glucose-Capillary: 70 mg/dL (ref 70–99)
Glucose-Capillary: 130 mg/dL — ABNORMAL HIGH (ref 70–99)

## 2022-05-05 LAB — RPR: RPR Ser Ql: NONREACTIVE

## 2022-05-05 NOTE — Progress Notes (Signed)
Post Partum Day 1 Subjective: Doing well, no complaints.  Tolerating regular diet, pain with PO meds, voiding and ambulating without difficulty.  No CP SOB Fever,Chills, N/V or leg pain; denies nipple or breast pain no HA change of vision, RUQ/epigastric pain  Objective: BP 116/80 (BP Location: Left Arm)   Pulse 86   Temp 98.7 F (37.1 C) (Axillary)   Resp 18   Ht '5\' 4"'$  (1.626 m)   Wt 98.4 kg   LMP 08/06/2021 (Exact Date)   SpO2 97%   Breastfeeding Unknown   BMI 37.25 kg/m    Physical Exam:  General: NAD Breasts: soft/nontender CV: RRR Pulm: nl effort, CTABL Abdomen: soft, NT, BS x 4 Perineum: minimal edema, intact Lochia: moderate Uterine Fundus: fundus firm and 1 fb below umbilicus DVT Evaluation: no cords, ttp LEs   Recent Labs    05/04/22 0843 05/05/22 0536  HGB 11.7* 10.9*  HCT 36.2 33.7*  WBC 9.8 13.6*  PLT 245 217    Assessment/Plan: 37 y.o. [id number] postpartum day # 1  - Continue routine PP care - Lactation consult prn - Discussed contraceptive options including implant, IUDs hormonal and non-hormonal, injection, pills/ring/patch, condoms, and NFP. Considered POPs or Nexplanon - Acute blood loss anemia - hemodynamically stable and asymptomatic; start po ferrous sulfate BID with stool softeners  - Immunization status: all Imms up to date    Disposition: Does not desire Dc home today.     Francetta Found, CNM 05/05/2022  1:24 PM

## 2022-05-05 NOTE — Plan of Care (Signed)
PP

## 2022-05-05 NOTE — Progress Notes (Signed)
Pt stated she has been snacking all day   getting ready to order her food

## 2022-05-05 NOTE — Lactation Note (Addendum)
This note was copied from a baby's chart. Lactation Consultation Note  Patient Name: Judy Gomez [id number] Date: 05/05/2022 Reason for consult: Initial assessment;Early term 37-38.6wks;Breastfeeding assistance;RN request Age:36 hours  Maternal Data This is mom's 4th baby, SVD. Mom with history of GDM, vit D deficiency, iron deficiency anemia, and ovarian cyst. Mom is an experienced breastfeeding mother.  On initial visit today mom reports baby initially latched and breastfed but is now sleepy and has not awakened to breastfeed. LC assisted mom with breastfeeding.  Has patient been taught Hand Expression?: Yes Does the patient have breastfeeding experience prior to this delivery?: Yes How long did the patient breastfeed?: 9-10 months  Feeding Mother's Current Feeding Choice: Breast Milk  Unswaddled and attempted to arouse baby. Mom attempted but baby asleep at the breast. At this time baby was due a bath. After baby received bath he awakened. Assisted mother with maximizing position and latch techniques. It was several attempts before baby would latch however once he latched he maintained the latch well. Multiple audible swallows were noted by mom and LC.  LATCH Score Latch: Repeated attempts needed to sustain latch, nipple held in mouth throughout feeding, stimulation needed to elicit sucking reflex. (Several attempts for baby to get latched, was latched was rhythmical with sucking.)  Audible Swallowing: Spontaneous and intermittent  Type of Nipple: Everted at rest and after stimulation  Comfort (Breast/Nipple): Soft / non-tender  Hold (Positioning): Assistance needed to correctly position infant at breast and maintain latch.  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 Support and encouragement given.  Consult Status Consult Status: Follow-up Date: 05/06/22 Follow-up type: In-patient  Update provided to care nurse.  Jonna Kadisha Goodine 05/05/2022, 2:32 PM

## 2022-05-06 LAB — GLUCOSE, CAPILLARY: Glucose-Capillary: 78 mg/dL (ref 70–99)

## 2022-05-06 MED ORDER — WITCH HAZEL-GLYCERIN EX PADS: 1.0000 | MEDICATED_PAD | CUTANEOUS | 12 refills | Status: AC | PRN

## 2022-05-06 MED ORDER — IBUPROFEN 600 MG PO TABS: 600.0000 mg | ORAL_TABLET | Freq: Four times a day (QID) | ORAL | 0 refills | Status: AC

## 2022-05-06 MED ORDER — FERROUS SULFATE 325 (65 FE) MG PO TABS: 325.0000 mg | ORAL_TABLET | Freq: Two times a day (BID) | ORAL | 3 refills | Status: AC

## 2022-05-06 MED ORDER — ACETAMINOPHEN 325 MG PO TABS: 650.0000 mg | ORAL_TABLET | ORAL | Status: AC | PRN

## 2022-05-06 MED ORDER — BENZOCAINE-MENTHOL 20-0.5 % EX AERO: 1.0000 | INHALATION_SPRAY | CUTANEOUS | Status: AC | PRN

## 2022-05-06 NOTE — Anesthesia Postprocedure Evaluation (Signed)
Anesthesia Post Note  Patient: Judy Gomez  Procedure(s) Performed: AN AD [place]  Patient location during evaluation: Mother Baby Anesthesia Type: Epidural Level of consciousness: awake and alert Pain management: pain level controlled Vital Signs Assessment: post-procedure vital signs reviewed and stable Respiratory status: spontaneous breathing, nonlabored ventilation and respiratory function stable Cardiovascular status: stable Postop Assessment: no headache, no backache and epidural receding Anesthetic complications: no   No notable events documented.   Last Vitals:  Vitals:   05/05/22 1608 05/05/22 2304  BP: 123/79 119/80  Pulse: 71 72  Resp: 18 18  Temp: 36.9 C 37.1 C  SpO2:  99%    Last Pain:  Vitals:   05/06/22 0258  TempSrc:   PainSc: 0-No pain                 Sho Salguero Lorenza Chick

## 2022-05-06 NOTE — Inpatient Diabetes Management (Signed)
Inpatient Diabetes Program Recommendations  AACE/ADA: New Consensus Statement on Inpatient Glycemic Control   Target Ranges:  Prepandial:   less than 140 mg/dL      Peak postprandial:   less than 180 mg/dL (1-2 hours)      Critically ill patients:  140 - 180 mg/dL    Latest Reference Range & Units 05/05/22 08:00 05/05/22 13:26 05/05/22 18:23 05/05/22 21:40 05/05/22 23:01 05/06/22 07:39  Glucose-Capillary 70 - 99 mg/dL 70 100 (H) 118 (H) 70 130 (H) 78    Latest Reference Range & Units 05/04/22 08:43  Glucose 70 - 99 mg/dL 85   Review of Glycemic Control  Diabetes history: GDM  Outpatient Diabetes medications: None Current orders for Inpatient glycemic control: Novolog 0-9 units TID with meals  Inpatient Diabetes Program Recommendations:    Insulin and CBGs: Glucose has ranged from 70-130 mg/dl over the past 24 hours and no insulin given. Would recommend to discontinue Novolog correction as glucose has returned to baseline since patient has delivered.  NOTE: Noted consult for diabetes coordinator. Per chart, patient presented to hospital on 05/04/22 for IOL due to poor control of GDM. Initial glucose was 85 mg/dl on 05/04/22 at 8:43 am.   Thanks, Barnie Alderman, RN, MSN, [place] Diabetes Coordinator Inpatient Diabetes Program [phone] (Team Pager from 8am to [place])

## 2022-05-06 NOTE — Discharge Instructions (Signed)
Discharge instructions:   Call office if you have any of the following:  headache, visual changes, fever >101.0 F, chills, breast concerns (engorgement, mastitis) excessive vaginal bleeding, incision drainage or problems, leg pain or redness, depression or any other concerns.   Activity: Do not lift > 10 lbs for 6 weeks.  No intercourse or tampons for 6 weeks.  No driving for 1-2 weeks or while taking pain medication. No strenuous activity or heavy lifting for 6 weeks.  No swimming pools, hot tubs or tub baths- showers only.    It is normal to bleed for up to 6 weeks. You should not soak through more than 1 pad in 1 hour.   Continue prenatal vitamin. Increase calories and fluids while breastfeeding.  Your milk will come in, in the next couple of days (right now it is colostrum).  You may have a slight fever when your milk comes in, but it should go away on its own.   If it does not, and rises above 101 F please call the doctor.  You will also feel achy and your breasts will be firm. They will also start to leak.  If you are breastfeeding, continue as you have been and you can pump/express milk for comfort.   For concerns about your baby, please call your pediatrician For breastfeeding concerns, the lactation consultant can be reached at [phone]  Postpartum blues (feelings of happy one minute and sad another minute) are normal for the first few weeks but if it gets worse let your doctor know.

## 2022-05-06 NOTE — Lactation Note (Signed)
This note was copied from a baby's chart. Lactation Consultation Note  Patient Name: Judy Gomez [id number] Date: 05/06/2022 Reason for consult: Follow-up assessment;Mother's request;Early term 37-38.6wks Age:36 hours  Maternal Data Has patient been taught Hand Expression?: Yes Does the patient have breastfeeding experience prior to this delivery?: Yes How long did the patient breastfeed?: 9-10 months  Mom overall doing well, ready for discharge. Concerned about baby not feeding frequently since overnight.  Feeding Mother's Current Feeding Choice: Breast Milk  Baby has been exclusively BF since delivery. Void/stools exceed minimum expectations, bili WNL, and 24 hr testing went well. Pediatrician reassured mom about baby's feeding pattern and encouraged to continue with frequent efforts/attempts.  LATCH Score   Attempted to feed post return from circ; baby latched but sat at nipple, fell asleep  Lactation Tools Discussed/Used    Interventions Interventions: Breast feeding basics reviewed;Hand express;Pre-pump if needed;Education (Feeding behaviors post circ, 8-12 feedings/24 hours, output expectations, hand expression to encourage feeding, frequent milk removal if baby is sleepy to protect supply, soften breast prior to feed if needed for deep latch)  Basic BF education given (see above).  Tips given to wake baby/keep baby awake and alert at the breast. Strategies given to increase feeding frequency once baby recovers from circumcision. Option for milk removal from hand expression, hand pump, or EBP while baby is sleepy to protect supply.  Discharge Discharge Education: Engorgement and breast care;Warning signs for feeding baby;Outpatient recommendation Pump: Personal  Anticipatory guidance given for management of breast changes including breast fullness/engorgement and nipple care.  Consult Status Consult Status: Complete  Outpatient lactation number provided;  encouraged to call with questions/concerns and ongoing BF support as needed.  Lavonia Drafts 05/06/2022, 9:30 AM

## 2022-05-08 ENCOUNTER — Ambulatory Visit: Payer: Managed Care, Other (non HMO) | Admitting: Dietician

## 2022-05-09 ENCOUNTER — Encounter: Payer: Self-pay | Admitting: Dietician

## 2022-05-09 NOTE — Progress Notes (Unsigned)
Patient did not keep appointment on 05/08/22, but had delivered her baby on 05/04/22. Sent notification to referring provider.

## 2022-07-08 ENCOUNTER — Telehealth: Payer: Self-pay

## 2022-07-08 NOTE — Telephone Encounter (Signed)
[facility]- Discharge Call Backs-Left pt a VM about the following below. 1-Do you have any questions or concerns about yourself as you heal? 2-Any concerns or questions about your baby? 3-How was your stay at the hospital? 4-How did our team work together to care for you? You should be receiving a survey in the mail soon.   We would really appreciate it if you could fill that out for us and return it in the mail.  We value the feedback to make improvements and continue the great work we do.   If you have any questions please feel free to call me back at [phone]  

## 2023-06-03 IMAGING — US US PELVIS COMPLETE WITH TRANSVAGINAL
1 series · 13 of 25 positions shown · non-contrast
Comparison: CT abdomen pelvis dated 12/05/2015.

CLINICAL DATA: Ovarian cyst or teratoma.  History of miscarriage.

EXAM:
TRANSABDOMINAL AND TRANSVAGINAL ULTRASOUND OF PELVIS
TECHNIQUE: Both transabdominal and transvaginal ultrasound examinations of the
pelvis were performed. Transabdominal technique was performed for
global imaging of the pelvis including uterus, ovaries, adnexal
regions, and pelvic cul-de-sac. It was necessary to proceed with
endovaginal exam following the transabdominal exam to visualize the
endometrium and ovaries.

[Series 1: gyn us · 13 of 110 slices shown]
[im 1/110]
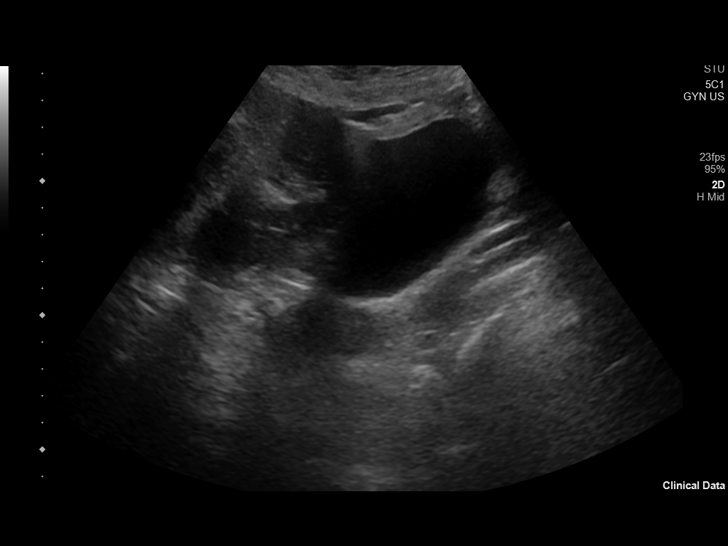
[im 10/110]
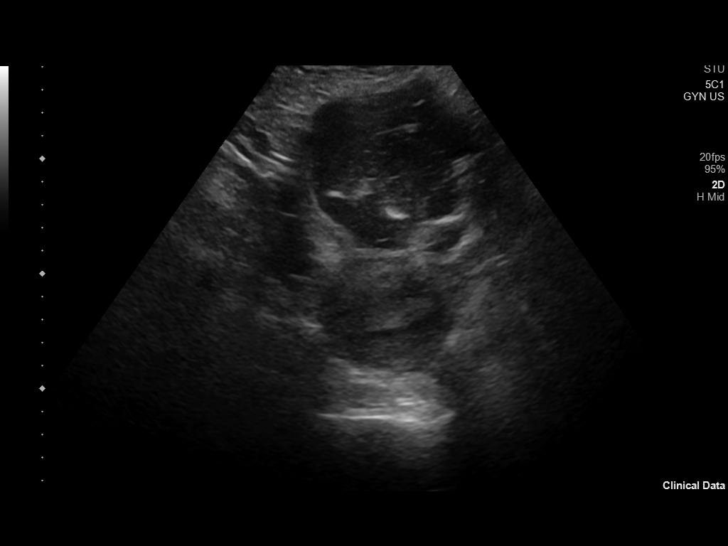
[im 19/110]
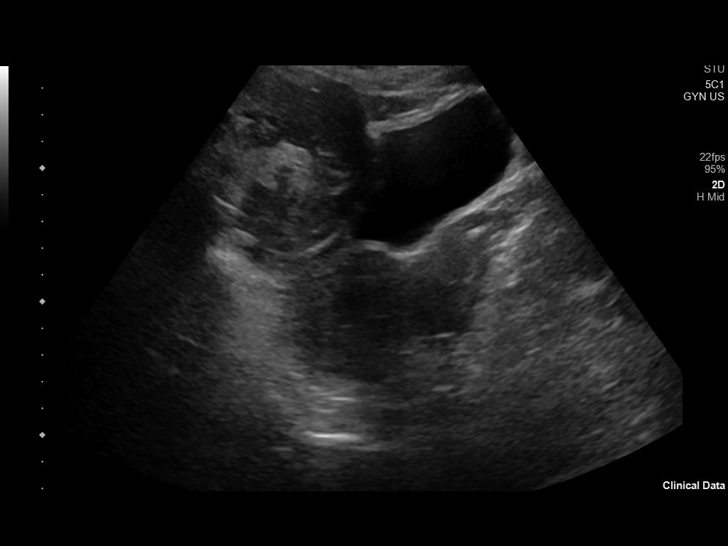
[im 28/110]
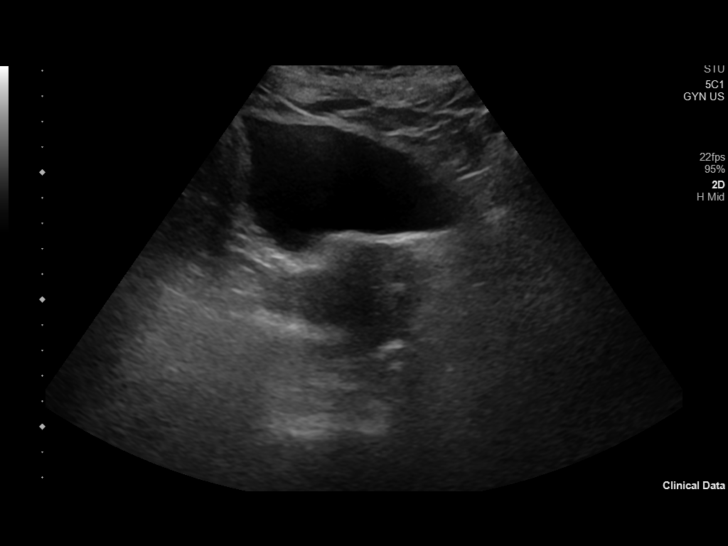
[im 37/110]
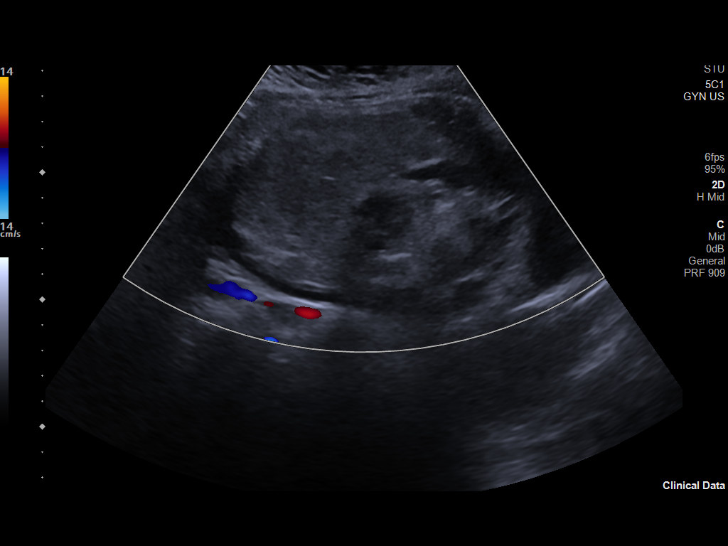
[im 46/110]
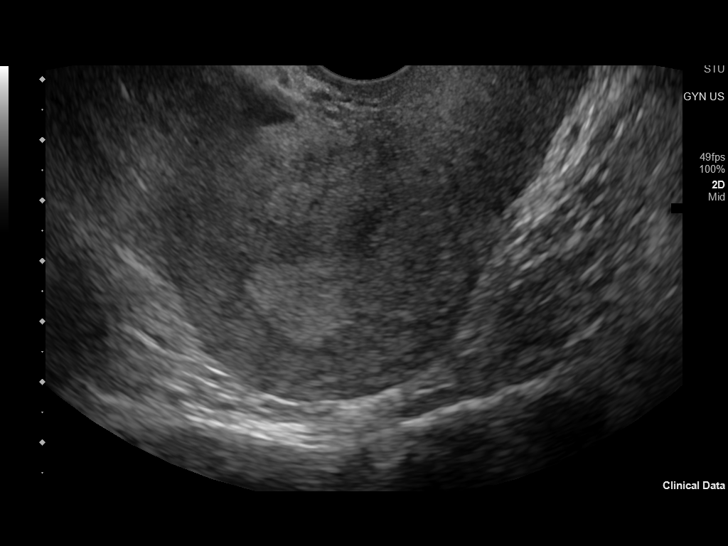
[im 55/110]
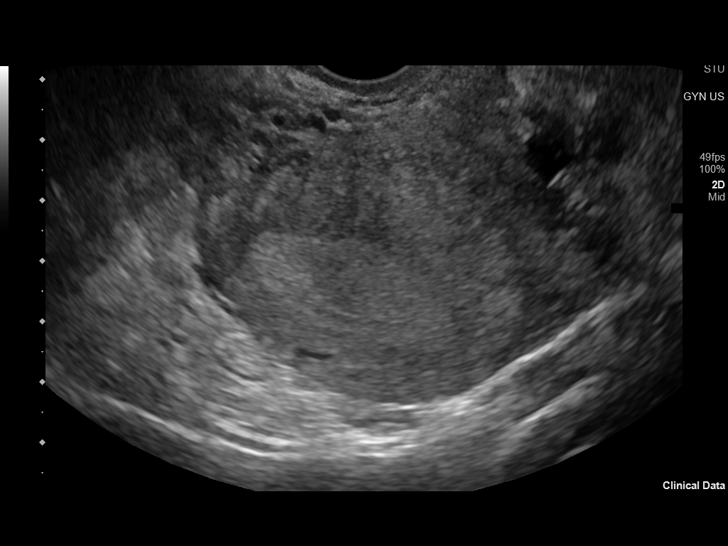
[im 64/110]
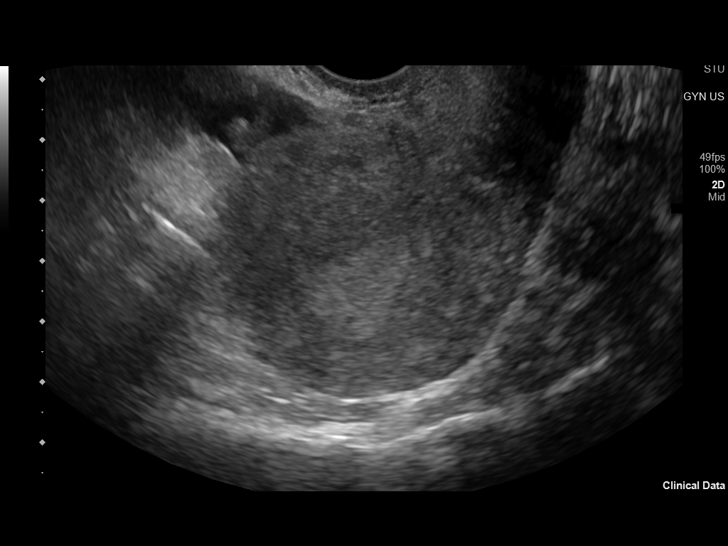
[im 73/110]
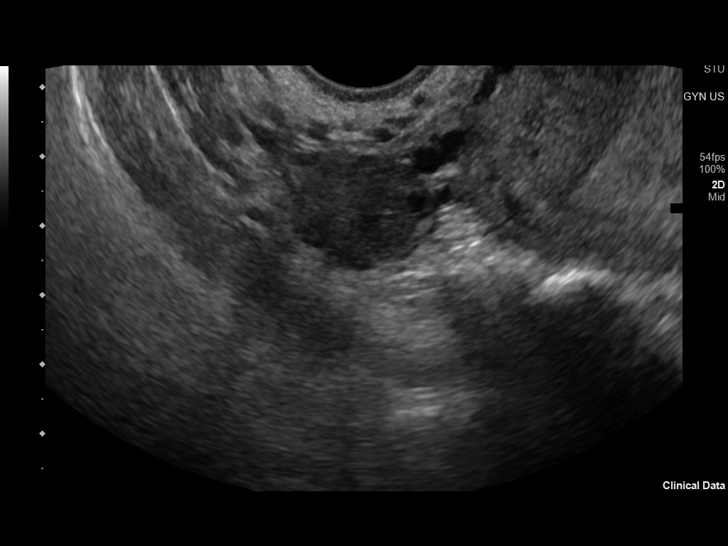
[im 82/110]
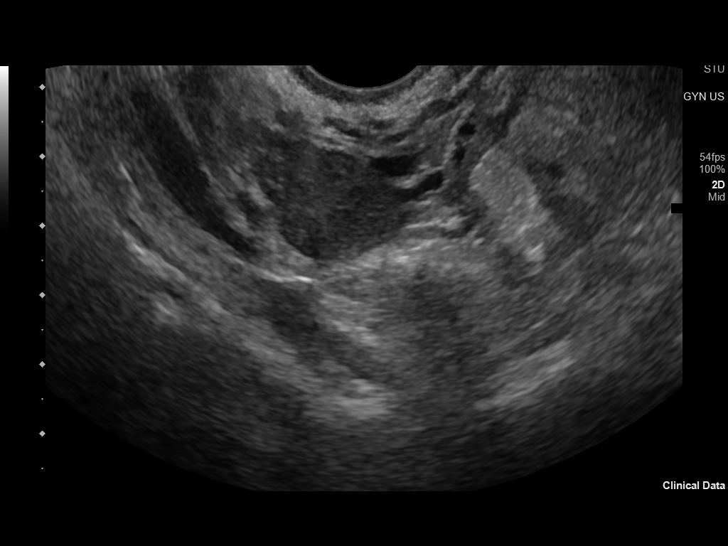
[im 91/110]
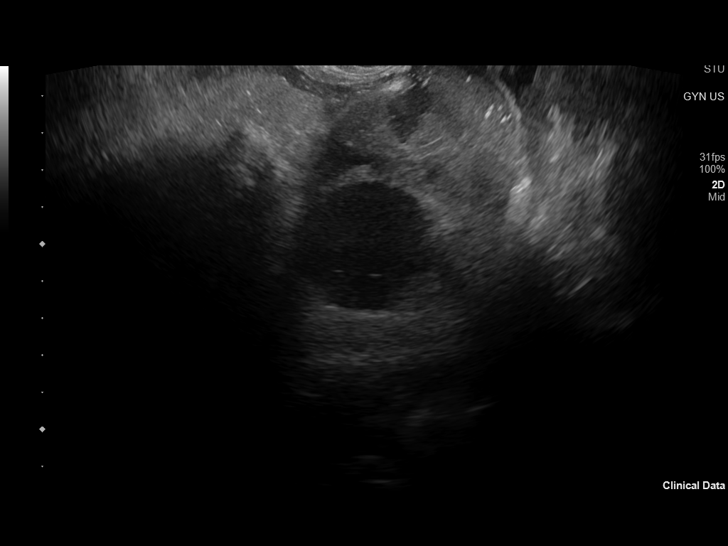
[im 100/110]
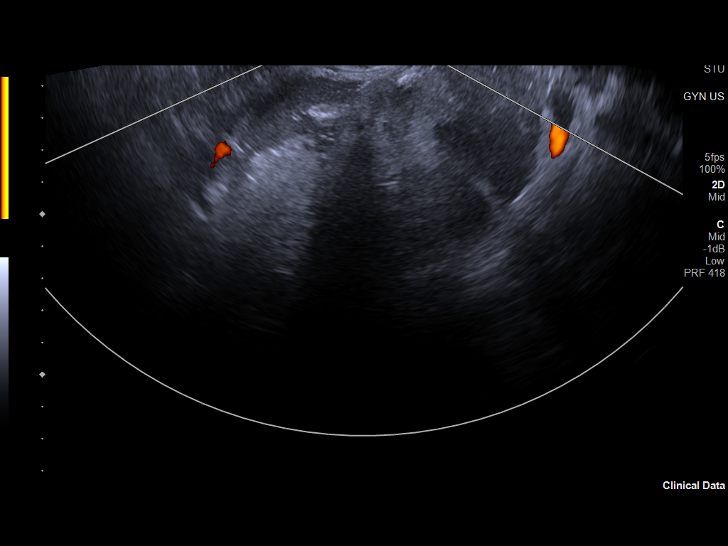
[im 110/110]
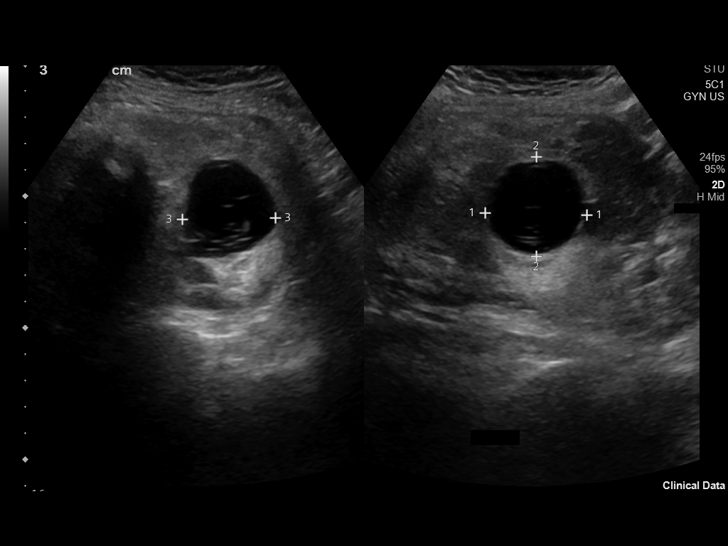

[13 of 25 positions shown; findings below may reference images not displayed]

FINDINGS: Uterus

Measurements: 7.1 x 5.1 x 6.2 cm = volume: 118 mL. The uterus is
slightly heterogeneous without a focal mass.

Endometrium

Thickness: 20 mm.  No focal abnormality visualized.

Right ovary

Measurements: 3.1 x 1.7 x 2.7 cm = volume: 7.7 ML. There is a corpus
luteum or complex cyst in the right ovary.

Left ovary

The left ovary is poorly visualized. There is a 12 x 8 x 11 cm
complex and heterogeneous mass with mixed echogenicity in keeping
with no teratoma. This is however suboptimally visualized and
evaluated on this ultrasound due to large size. This however appears
to have grown since the CT of 12/05/2015. CT or MRI may provide
better evaluation.

Other findings

Small free fluid in the pelvis.
IMPRESSION: 1. Large heterogeneous mass extending from the left adnexa into the
anterior pelvis in keeping with previously known teratoma. This may
have increased in size since the prior CT.
2. Slightly heterogeneous uterus.
3. Unremarkable endometrium and right ovary.

## 2023-06-03 IMAGING — US US ABDOMEN LIMITED
1 series · 14 of 25 positions shown · non-contrast
Comparison: CT dated 12/05/2015.

CLINICAL DATA: Malaise and fatigue.

EXAM:
ULTRASOUND ABDOMEN LIMITED RIGHT UPPER QUADRANT

[Series 1: us abdomen limited ruq (liver/gb) · 14 of 86 slices shown]
[im 1/86]
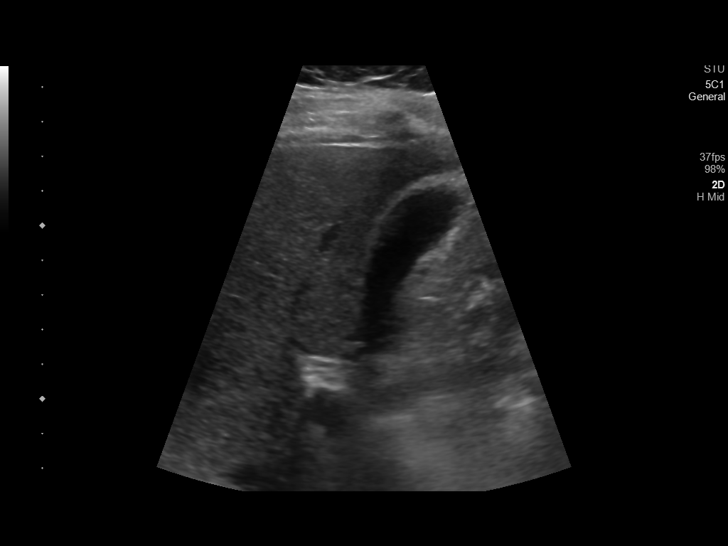
[im 8/86]
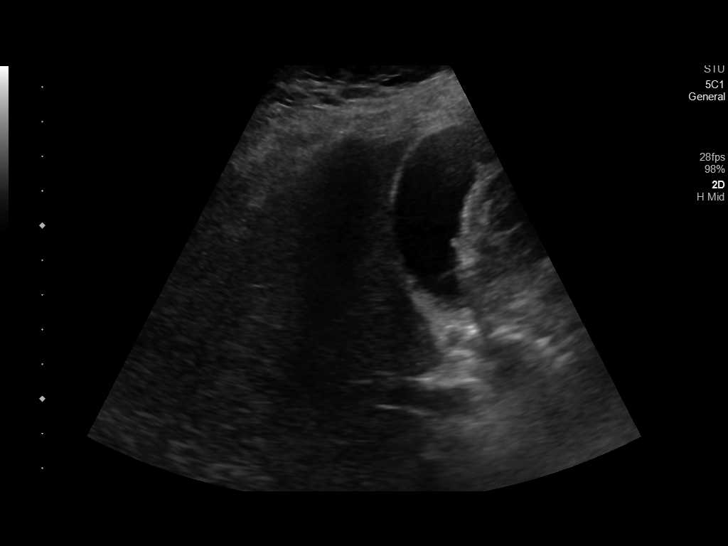
[im 15/86]
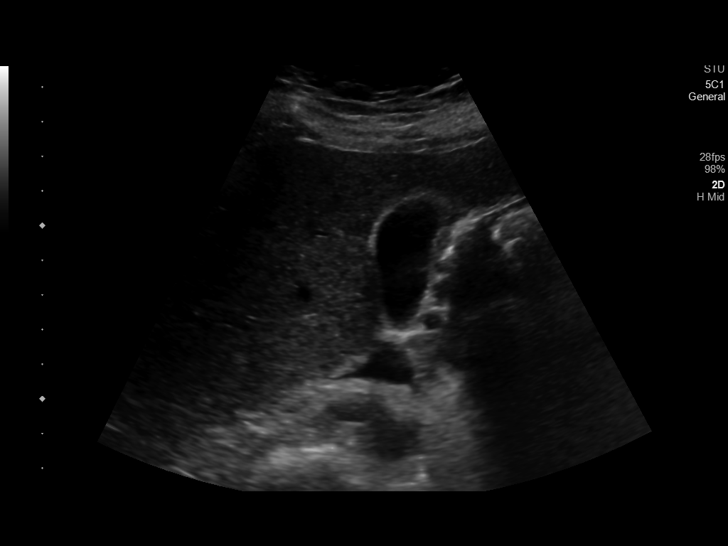
[im 22/86]
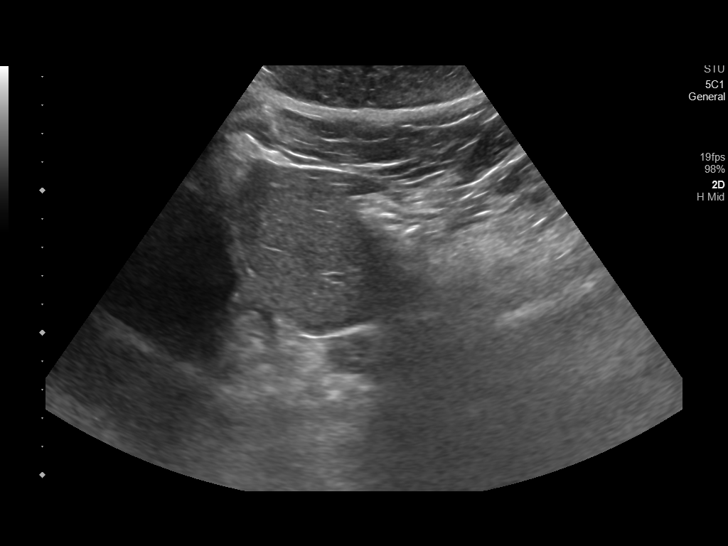
[im 29/86]
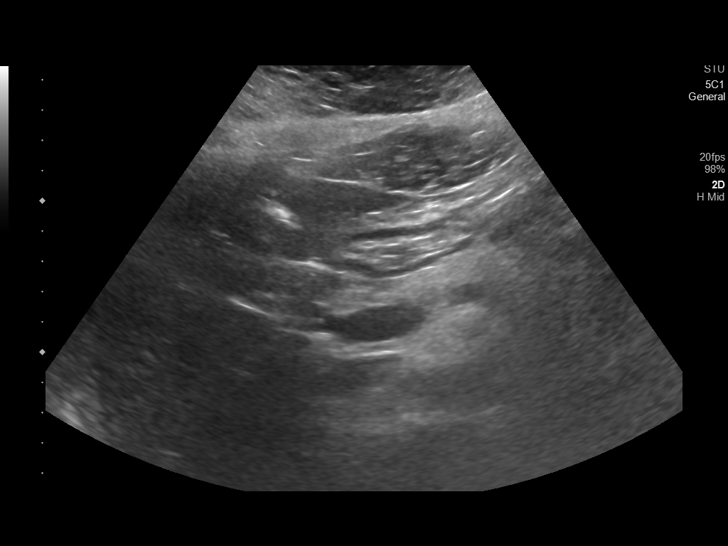
[im 32/86]
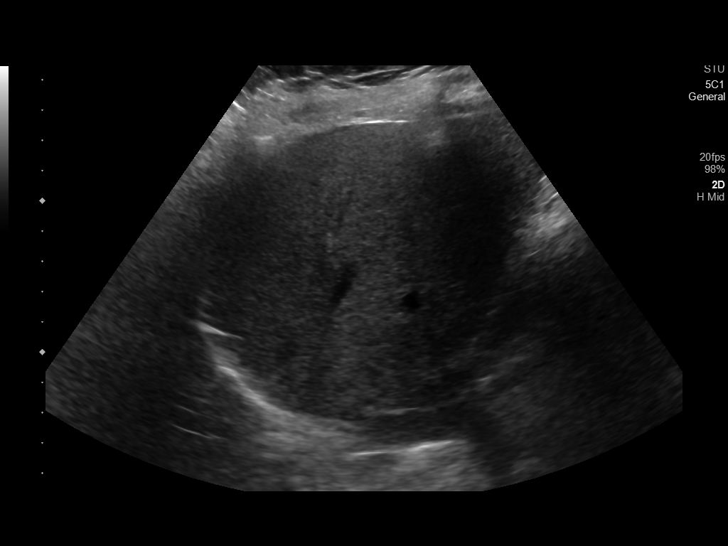
[im 39/86]
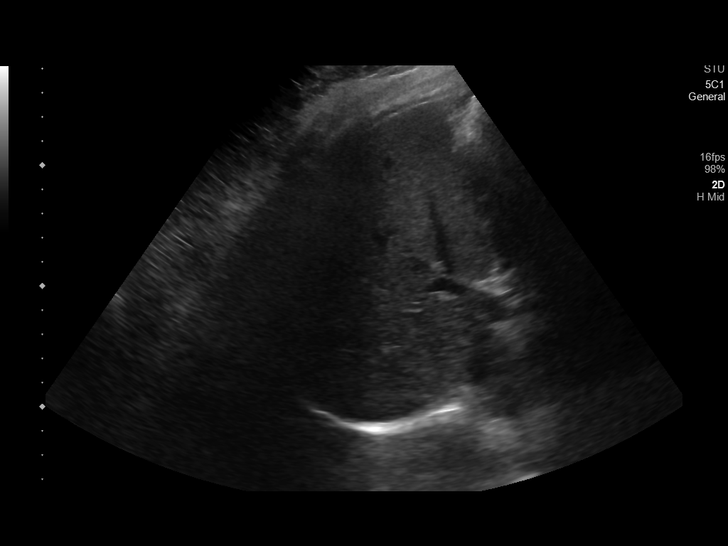
[im 47/86]
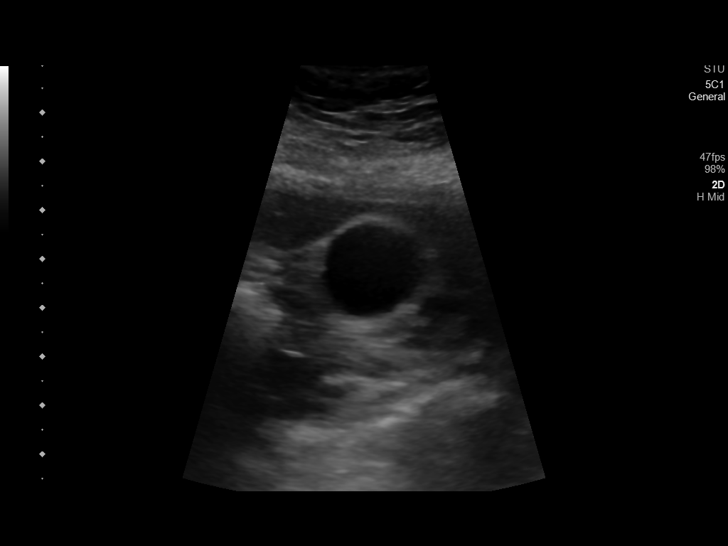
[im 54/86]
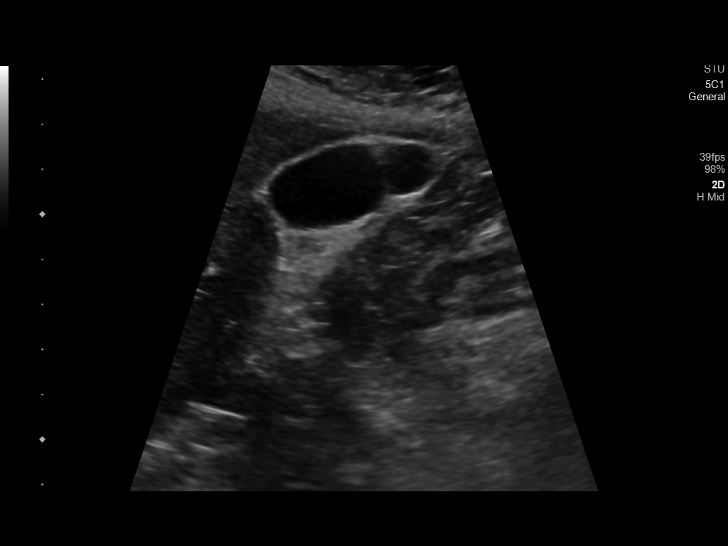
[im 57/86]
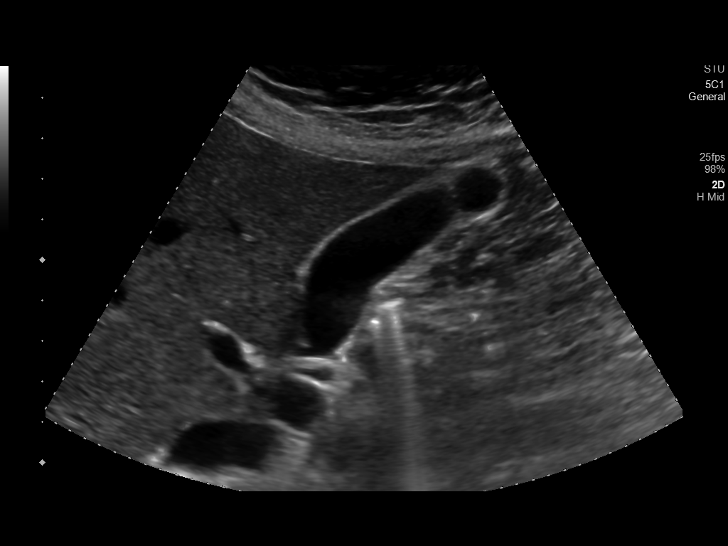
[im 64/86]
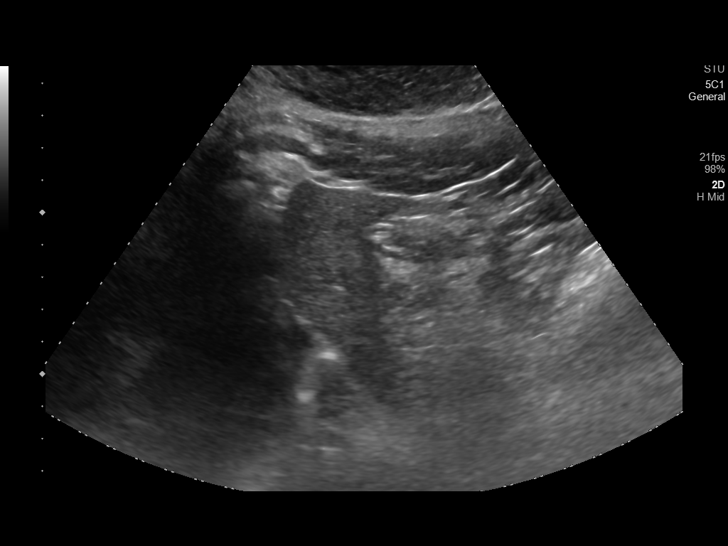
[im 71/86]
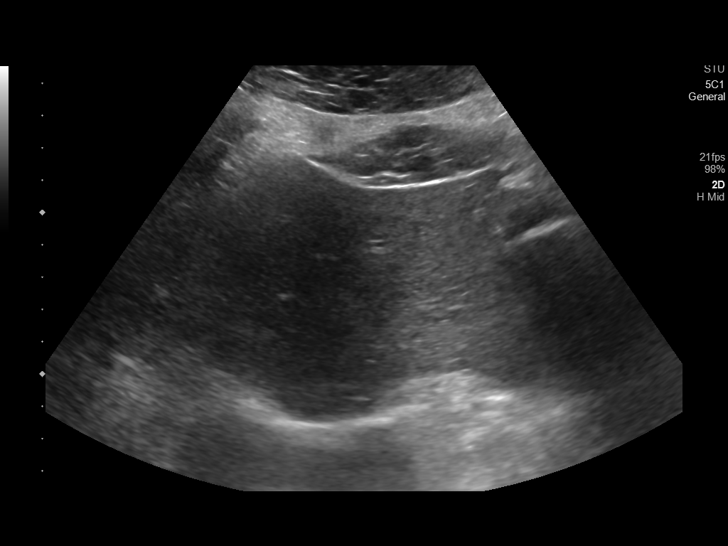
[im 78/86]
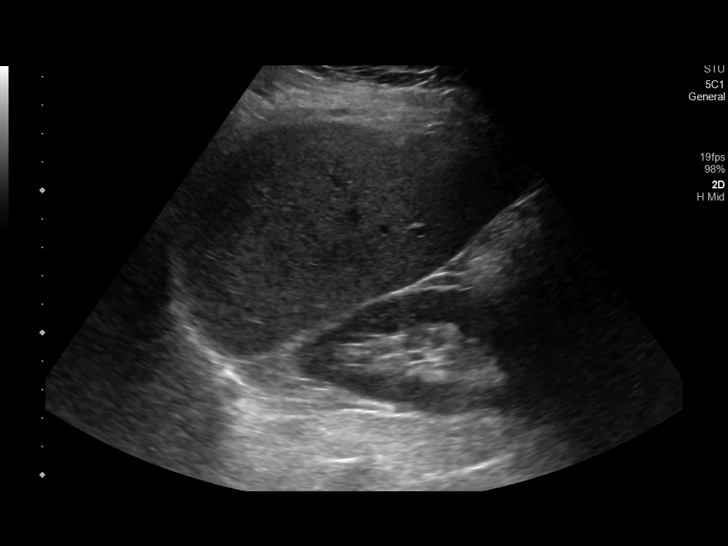
[im 86/86]
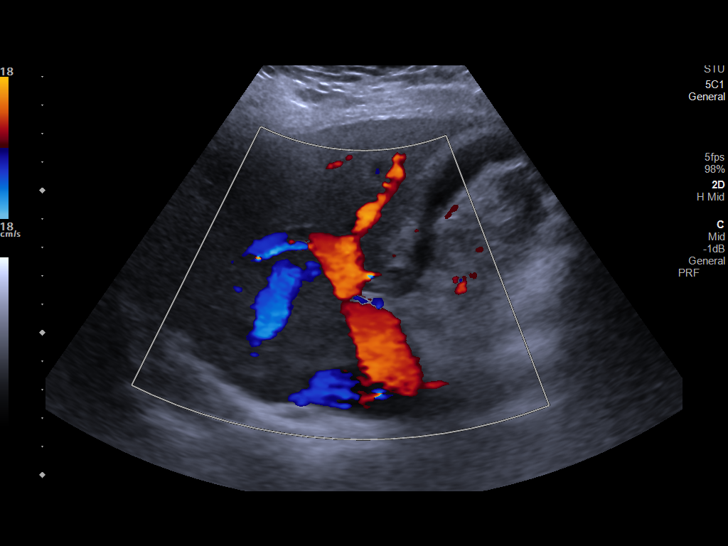

[14 of 25 positions shown; findings below may reference images not displayed]

FINDINGS: Gallbladder:

No gallstones or wall thickening visualized. No sonographic Murphy
sign noted by sonographer. A 3 mm gallbladder polyp.

Common bile duct:

Diameter: 4 mm

Liver:

No focal lesion identified. Within normal limits in parenchymal
echogenicity. Portal vein is patent on color Doppler imaging with
normal direction of blood flow towards the liver.

Other: None.
IMPRESSION: A 3 mm gallbladder polyp, otherwise unremarkable right upper
quadrant ultrasound

## 2023-07-14 ENCOUNTER — Other Ambulatory Visit: Payer: Self-pay | Admitting: Internal Medicine

## 2023-07-14 DIAGNOSIS — Z8742 Personal history of other diseases of the female genital tract: Secondary | ICD-10-CM

## 2023-07-23 ENCOUNTER — Ambulatory Visit: Payer: Self-pay
